# Patient Record
Sex: Male | Born: 1945 | Race: White | Hispanic: No | Smoking: Never smoker
Health system: Southern US, Community
[De-identification: ages and names within clinical notes are randomized; demographics above are authoritative.]

## PROBLEM LIST (undated history)

## (undated) DIAGNOSIS — N201 Calculus of ureter: Secondary | ICD-10-CM

## (undated) DIAGNOSIS — I10 Essential (primary) hypertension: Secondary | ICD-10-CM

## (undated) DIAGNOSIS — E785 Hyperlipidemia, unspecified: Secondary | ICD-10-CM

## (undated) HISTORY — PX: CATARACT EXTRACTION W/ INTRAOCULAR LENS  IMPLANT, BILATERAL: SHX1307

---
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
## 2009-08-29 HISTORY — PX: RETINAL DETACHMENT SURGERY: SHX105

## 2010-03-07 ENCOUNTER — Emergency Department (HOSPITAL_COMMUNITY): Admission: EM | Admit: 2010-03-07 | Discharge: 2010-03-07 | Payer: Self-pay | Admitting: Emergency Medicine

## 2010-06-28 ENCOUNTER — Ambulatory Visit: Payer: Self-pay | Admitting: Sports Medicine

## 2010-06-28 DIAGNOSIS — R269 Unspecified abnormalities of gait and mobility: Secondary | ICD-10-CM | POA: Insufficient documentation

## 2010-06-28 DIAGNOSIS — M25569 Pain in unspecified knee: Secondary | ICD-10-CM | POA: Insufficient documentation

## 2010-06-28 DIAGNOSIS — M2170 Unequal limb length (acquired), unspecified site: Secondary | ICD-10-CM | POA: Insufficient documentation

## 2010-08-12 ENCOUNTER — Ambulatory Visit: Payer: Self-pay | Admitting: Sports Medicine

## 2010-09-28 NOTE — Assessment & Plan Note (Signed)
Summary: NP,KNEE ISSUES,RUNNER,MC   Vital Signs:  Patient profile:   65 year old male Height:      75 inches Weight:      190 pounds BMI:     23.83 BP sitting:   143 / 86  Vitals Entered By: Lillia Pauls CMA (June 28, 2010 9:54 AM)   History of Present Illness: Pain in right knee. Pt has history of old medial miniscus surg in 4. He has continued to run and stay fit since then. However in July of this year he had to take 6 weeks off from running following an eye surgery.  Upon resumption of running he noted pain and swelling of the right knee.  He feels a dull ache posteriorlly. He also notes lack of flexion in his knee. He has stoped running since mid October due to these symptoms.   Preventive Screening-Counseling & Management  Alcohol-Tobacco     Smoking Status: never  Current Problems (verified): 1)  Abnormality of Gait  (ICD-781.2) 2)  Knee Pain, Right, Chronic  (ICD-719.46) 3)  Unequal Leg Length  (ICD-736.81)  Allergies (verified): No Known Drug Allergies  Past History:  Past Surgical History: Right knee likely medial meniscectomy 1976  Social History: No tob, min alcohol. Likes to run or bike dailySmoking Status:  never  Review of Systems  The patient denies chest pain, dyspnea on exertion, and difficulty walking.    Physical Exam  General:  Vs noted.  Tall male in NAD Msk:  Right leg Mature scar medial to patalla. No effusion noted. No skin changes.  ROM limited by 5 deg extension and 20 deg flexion. Normal on left.  Hip ROM is WNL BL Quad bulk is diminished compaired to left.  Strength is 5/5 except for right hip abduction which is 4+/5.  Patalla grind is neg, lachmans and valgus and varus stress is neg. MuMurey's is neg.  Raymondo Band is mildly pos.   Leg length right is 1cm shorter than left both laying and sitting.   Feet: Relativly normal appearing. Calus laterally BL.  Arches WNL.  Gait: Ganus valgus with walking and running. Right foot is  mildly suppinated.    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 Has also resulted in a lack of full extension. This has resulted in a leg length discrepency.  Plan: Body Helix knee sleve, and heel wedge. Also home PT exercises to build quad bulk (quad tense, straight leg raise, and knee extension) and lateral leg raise for hip abductor strength.  Will resume modified activity with every other day running up to 2 miles with bike the other day. RICE after running.  Follow up in 6 weeks.   His updated medication list for this problem includes:    Aspirin 325 Mg Tabs (Aspirin) .Marland Kitchen... Take 1 by mouth once daily  Orders: Garment,belt,sleeve or other covering ,elastic or similar stretch (Z6109) Sports Insoles (L3510) Foot Orthosis ( Arch Strap/Heel Cup) [phone])  Problem # 2:  UNEQUAL LEG LENGTH (ICD-736.81) Assessment: New As noted.  Corrected with heel wedge and Hapads sports insoles.   Problem # 3:  ABNORMALITY OF GAIT (ICD-781.2) Assessment: New Knee genus valgus.   try to correct with wege lift  - medial build up  Complete Medication List: 1)  Lovastatin 40 Mg Tabs (Lovastatin) .... Take 1 by mouth once daily 2)  Lisinopril 20 Mg Tabs (Lisinopril) .... Take 1 by mouth once  daily 3)  Finacea 15 % Gel (Azelaic acid) .... Apply to affected area of face once daily 4)  Aspirin 325 Mg Tabs (Aspirin) .... Take 1 by mouth once daily 5)  Fish Oil Concentrate 300 Mg Caps (Omega-3 fatty acids) .... Take 3 tablets by mouth once daily 6)  Daily Multiple Vitamins Tabs (Multiple vitamin) .... Take 1 by mouth once daily  Patient Instructions: 1)  Thank you for seeing me today. 2)  You can run up to 2 miles every other day. Wear your brace and orthotics.  3)  Bike on the other days as much as you want. 4)  Ice your knee following runs.  A bag of frozen peas works well.  5)  Do the exercises that we  talked about.  6)  Quad tensing, straight leg raise, knee extension, and side leg raise. 7)  Follow up in 6 weeks.  8)  Continue to reccord your workouts and mark down when you have pain or swelling.  9)  Ibuprofen 800mg  every 8 hours of aleive 2 pills twice a day as needed for pain.    Orders Added: 1)  Garment,belt,sleeve or other covering ,elastic or similar stretch [A4466] 2)  Sports Insoles [L3510] 3)  Foot Orthosis ( Arch Strap/Heel Cup) [B1478] 4)  New Patient Level II [29562]

## 2010-09-30 NOTE — Assessment & Plan Note (Signed)
Summary: F/U,MC   Vital Signs:  Patient profile:   65 year old male BP sitting:   143 / 80  Vitals Entered By: AMY CHANEY,RN CC: f/u rt knee pain - 75% improved   CC:  f/u rt knee pain - 75% improved.  History of Present Illness: Pt reports to clinic for f/u of rt knee pain which he reports is 75 % improved.   Has been able to run every other day with less significant knee pain.   Doing knee exercises on days not running.   He had very remote meniscus surgery on RT knee This flared after he had to take 6 wks off running for retinal detachment  has built back up to 3 mile runs and would like to grad inc to 5  Allergies: No Known Drug Allergies  Physical Exam  General:  Well-developed,well-nourished,in no acute distress; alert,appropriate and cooperative throughout examination Msk:  Rt knee lacks 5 degrees extension No effusion today on rt Medial spurring noted on rt Slight limitation on flexion rt knee Positive clicking with Mcmurray's, not painful  Running gait shows less genu valgus with wedge in place note the knee flexion contracture gives him a leg length difference of 1 cm and functionally changes knee position on gait     Impression & Recommendations:  Problem # 1:  KNEE PAIN, RIGHT, CHRONIC (ICD-719.46)  His updated medication list for this problem includes:    Aspirin 325 Mg Tabs (Aspirin) .Marland Kitchen... Take 1 by mouth once daily   keep up exercises 3 x per week  Problem # 2:  ABNORMALITY OF GAIT (ICD-781.2) use insoles and wedges in running shoes  try wedges in reg walking shoes  Problem # 3:  UNEQUAL LEG LENGTH (ICD-736.81) this is acquired and we will cont to add lift to RT  can reck as needed if he cont to make excellent progress  Complete Medication List: 1)  Lovastatin 40 Mg Tabs (Lovastatin) .... Take 1 by mouth once daily 2)  Lisinopril 20 Mg Tabs (Lisinopril) .... Take 1 by mouth once daily 3)  Finacea 15 % Gel (Azelaic acid) .... Apply to  affected area of face once daily 4)  Aspirin 325 Mg Tabs (Aspirin) .... Take 1 by mouth once daily 5)  Fish Oil Concentrate 300 Mg Caps (Omega-3 fatty acids) .... Take 3 tablets by mouth once daily 6)  Daily Multiple Vitamins Tabs (Multiple vitamin) .... Take 1 by mouth once daily   Orders Added: 1)  Est. Patient Level III [16109]

## 2010-12-20 ENCOUNTER — Encounter: Payer: Self-pay | Admitting: *Deleted

## 2011-08-12 ENCOUNTER — Emergency Department (INDEPENDENT_AMBULATORY_CARE_PROVIDER_SITE_OTHER)
Admission: EM | Admit: 2011-08-12 | Discharge: 2011-08-12 | Disposition: A | Discharge status: Home / Self Care | Payer: PRIVATE HEALTH INSURANCE | Source: Home / Self Care | Attending: Family Medicine | Admitting: Family Medicine

## 2011-08-12 DIAGNOSIS — S39012A Strain of muscle, fascia and tendon of lower back, initial encounter: Secondary | ICD-10-CM

## 2011-08-12 DIAGNOSIS — S335XXA Sprain of ligaments of lumbar spine, initial encounter: Secondary | ICD-10-CM

## 2011-08-12 HISTORY — DX: Essential (primary) hypertension: I10

## 2011-08-12 MED ORDER — IBUPROFEN 800 MG PO TABS: 800.0000 mg | ORAL_TABLET | Freq: Three times a day (TID) | ORAL | Status: AC

## 2011-08-12 MED ORDER — CYCLOBENZAPRINE HCL 5 MG PO TABS: 5.0000 mg | ORAL_TABLET | Freq: Three times a day (TID) | ORAL | Status: AC | PRN

## 2011-08-12 NOTE — ED Notes (Signed)
C/o lt lower back pain that is non-radiating.  States started last Saturday- bent over, stood up and noticed the pain.  States he has had something similar before but it didn't persist this long.  Reports he is unable to jog like usual due to the discomfort.

## 2011-08-12 NOTE — ED Provider Notes (Signed)
History     CSN: [id number] Arrival date & time: 08/12/2011  8:34 AM   First MD Initiated Contact with Patient 08/12/11 [phone]      Chief Complaint  Patient presents with  . Back Pain    (Consider location/radiation/quality/duration/timing/severity/associated sxs/prior treatment) Patient is a 65 y.o. male presenting with back pain. The history is provided by the patient.  Back Pain  This is a new problem. The current episode started more than 1 week ago. The problem has been gradually improving. The pain is associated with twisting. The pain is present in the lumbar spine. The quality of the pain is described as stabbing. The pain does not radiate. The pain is mild. The symptoms are aggravated by twisting and bending. Pertinent negatives include no fever, no numbness, no abdominal pain, no dysuria, no leg pain, no paresthesias and no weakness.    Past Medical History  Diagnosis Date  . Hypertension   . High cholesterol     Past Surgical History  Procedure Date  . Cataract extraction   . Knee surgery   . Retinal detachment surgery     No family history on file.  History  Substance Use Topics  . Smoking status: Not on file  . Smokeless tobacco: Not on file  . Alcohol Use:       Review of Systems  Constitutional: Negative for fever.  Gastrointestinal: Negative for abdominal pain.  Genitourinary: Negative for dysuria, urgency and frequency.  Musculoskeletal: Positive for back pain. Negative for gait problem.  Neurological: Negative for weakness, numbness and paresthesias.    Allergies  Review of patient's allergies indicates no known allergies.  Home Medications   Current Outpatient Rx  Name Route Sig Dispense Refill  . ASPIRIN 325 MG PO TABS Oral Take 325 mg by mouth daily.      . AZELAIC ACID 15 % EX GEL  Apply to affected area of face once daily     . LISINOPRIL 20 MG PO TABS Oral Take 20 mg by mouth daily.      Marland Kitchen LOVASTATIN 40 MG PO TABS Oral Take 40 mg by  mouth daily.      Marland Kitchen DAILY MULTIVITAMIN PO Oral Take 1 tablet by mouth. Once daily     . FISH OIL CONCENTRATE 300 MG PO CAPS Oral Take 3 capsules by mouth. Once daily     . CYCLOBENZAPRINE HCL 5 MG PO TABS Oral Take 1 tablet (5 mg total) by mouth 3 (three) times daily as needed for muscle spasms. 15 tablet 0  . IBUPROFEN 800 MG PO TABS Oral Take 1 tablet (800 mg total) by mouth 3 (three) times daily. 30 tablet 0    BP 147/87  Pulse 76  Temp(Src) 97.8 F (36.6 C) (Oral)  Resp 18  SpO2 100%  Physical Exam  Nursing note and vitals reviewed. Constitutional: He appears well-developed and well-nourished.  HENT:  Head: Normocephalic.  Abdominal: Soft. Bowel sounds are normal.  Musculoskeletal: Normal range of motion. He exhibits tenderness. He exhibits no edema.       Lumbar back: He exhibits tenderness. He exhibits normal range of motion, no bony tenderness, no spasm and normal pulse.    ED Course  Procedures (including critical care time)  Labs Reviewed - No data to display No results found.   1. Strain of lumbar region       MDM          Barkley Bruns, MD 08/13/11 1723

## 2012-01-13 ENCOUNTER — Other Ambulatory Visit: Payer: Self-pay | Admitting: Internal Medicine

## 2012-01-13 DIAGNOSIS — I10 Essential (primary) hypertension: Secondary | ICD-10-CM

## 2012-01-19 ENCOUNTER — Other Ambulatory Visit: Payer: PRIVATE HEALTH INSURANCE

## 2013-01-15 ENCOUNTER — Other Ambulatory Visit: Payer: Self-pay | Admitting: Internal Medicine

## 2013-01-15 DIAGNOSIS — I10 Essential (primary) hypertension: Secondary | ICD-10-CM

## 2013-01-22 ENCOUNTER — Ambulatory Visit
Admission: RE | Admit: 2013-01-22 | Discharge: 2013-01-22 | Disposition: A | Discharge status: Home / Self Care | Payer: Medicare PPO | Source: Ambulatory Visit | Attending: Internal Medicine | Admitting: Internal Medicine

## 2013-01-22 DIAGNOSIS — I10 Essential (primary) hypertension: Secondary | ICD-10-CM

## 2013-07-12 ENCOUNTER — Encounter (HOSPITAL_COMMUNITY): Payer: Self-pay | Admitting: Emergency Medicine

## 2013-07-12 ENCOUNTER — Emergency Department (HOSPITAL_COMMUNITY): Payer: Medicare PPO

## 2013-07-12 ENCOUNTER — Emergency Department (HOSPITAL_COMMUNITY)
Admission: EM | Admit: 2013-07-12 | Discharge: 2013-07-13 | Disposition: A | Discharge status: Home / Self Care | Payer: Medicare PPO | Attending: Emergency Medicine | Admitting: Emergency Medicine

## 2013-07-12 ENCOUNTER — Emergency Department (HOSPITAL_COMMUNITY)
Admission: EM | Admit: 2013-07-12 | Discharge: 2013-07-12 | Disposition: A | Discharge status: Other Acute Inpatient Hospital | Payer: Medicare PPO | Source: Home / Self Care | Attending: Emergency Medicine | Admitting: Emergency Medicine

## 2013-07-12 DIAGNOSIS — N200 Calculus of kidney: Secondary | ICD-10-CM

## 2013-07-12 DIAGNOSIS — E7800 Pure hypercholesterolemia, unspecified: Secondary | ICD-10-CM | POA: Insufficient documentation

## 2013-07-12 DIAGNOSIS — R1032 Left lower quadrant pain: Secondary | ICD-10-CM

## 2013-07-12 DIAGNOSIS — N1330 Unspecified hydronephrosis: Secondary | ICD-10-CM

## 2013-07-12 DIAGNOSIS — Z7982 Long term (current) use of aspirin: Secondary | ICD-10-CM | POA: Insufficient documentation

## 2013-07-12 DIAGNOSIS — I10 Essential (primary) hypertension: Secondary | ICD-10-CM | POA: Insufficient documentation

## 2013-07-12 DIAGNOSIS — Z79899 Other long term (current) drug therapy: Secondary | ICD-10-CM | POA: Insufficient documentation

## 2013-07-12 LAB — CBC WITH DIFFERENTIAL/PLATELET
Basophils Relative: 0 % (ref 0–1)
Hemoglobin: 14.6 g/dL (ref 13.0–17.0)
Lymphs Abs: 0.7 10*3/uL (ref 0.7–4.0)
MCHC: 35.6 g/dL (ref 30.0–36.0)
Monocytes Relative: 4 % (ref 3–12)
Neutro Abs: 12.4 10*3/uL — ABNORMAL HIGH (ref 1.7–7.7)
Neutrophils Relative %: 91 % — ABNORMAL HIGH (ref 43–77)
RBC: 4.61 MIL/uL (ref 4.22–5.81)
WBC: 13.6 10*3/uL — ABNORMAL HIGH (ref 4.0–10.5)

## 2013-07-12 LAB — URINALYSIS, ROUTINE W REFLEX MICROSCOPIC
Glucose, UA: NEGATIVE mg/dL
Ketones, ur: 15 mg/dL — AB
Nitrite: NEGATIVE
pH: 5.5 (ref 5.0–8.0)

## 2013-07-12 LAB — COMPREHENSIVE METABOLIC PANEL
Albumin: 4.2 g/dL (ref 3.5–5.2)
Alkaline Phosphatase: 47 U/L (ref 39–117)
BUN: 17 mg/dL (ref 6–23)
Chloride: 101 mEq/L (ref 96–112)
Potassium: 4.1 mEq/L (ref 3.5–5.1)
Total Bilirubin: 0.4 mg/dL (ref 0.3–1.2)

## 2013-07-12 LAB — POCT URINALYSIS DIP (DEVICE)
Bilirubin Urine: NEGATIVE
Glucose, UA: NEGATIVE mg/dL
Leukocytes, UA: NEGATIVE
Nitrite: NEGATIVE

## 2013-07-12 LAB — LIPASE, BLOOD: Lipase: 17 U/L (ref 11–59)

## 2013-07-12 LAB — URINE MICROSCOPIC-ADD ON

## 2013-07-12 MED ORDER — IOHEXOL 300 MG/ML  SOLN
100.0000 mL | Freq: Once | INTRAMUSCULAR | Status: AC | PRN
  Administered 2013-07-12: 100 mL via INTRAVENOUS

## 2013-07-12 MED ORDER — MORPHINE SULFATE 4 MG/ML IJ SOLN
8.0000 mg | Freq: Once | INTRAMUSCULAR | Status: AC
  Administered 2013-07-12: 4 mg via INTRAVENOUS
  Filled 2013-07-12: qty 2

## 2013-07-12 MED ORDER — SODIUM CHLORIDE 0.9 % IV BOLUS (SEPSIS)
1000.0000 mL | Freq: Once | INTRAVENOUS | Status: AC
  Administered 2013-07-12: 1000 mL via INTRAVENOUS

## 2013-07-12 MED ORDER — IOHEXOL 300 MG/ML  SOLN
25.0000 mL | Freq: Once | INTRAMUSCULAR | Status: AC | PRN
  Administered 2013-07-12: 25 mL via ORAL

## 2013-07-12 MED ORDER — ONDANSETRON HCL 4 MG/2ML IJ SOLN
4.0000 mg | Freq: Once | INTRAMUSCULAR | Status: AC
  Administered 2013-07-12: 4 mg via INTRAVENOUS
  Filled 2013-07-12: qty 2

## 2013-07-12 MED ORDER — MORPHINE SULFATE 4 MG/ML IJ SOLN
4.0000 mg | Freq: Once | INTRAMUSCULAR | Status: AC
  Administered 2013-07-12: 4 mg via INTRAVENOUS
  Filled 2013-07-12: qty 1

## 2013-07-12 NOTE — ED Notes (Signed)
The pt was transferred from ucc with abd pain since this am no  n v or diarrhea.  The pt  Reports that he feels better now and they only collected a urine from him

## 2013-07-12 NOTE — ED Provider Notes (Signed)
CSN: [id number]     Arrival date & time 07/12/13  2011 History   First MD Initiated Contact with Patient 07/12/13 2135     Chief Complaint  Patient presents with  . Abdominal Pain   (Consider location/radiation/quality/duration/timing/severity/associated sxs/prior Treatment) HPI Comments: Patient is a 67 year old male with history of hypertension and high cholesterol who presents today with left lower quadrant pain. The pain began around 2 or 3 this afternoon. He went to urgent care because the pain was severe. It is a sharp pain in his left lower quadrant. His last bowel was yesterday. He has normal bowel movements daily. Today he passed some hard stool earlier. He notes that now the pain has moved into his left back. He also has a dull ache in his left testicle. He has had prior colonoscopies. His last colonoscopy was 2 years ago. He was told that it was normal at that time. No prior history of kidney stones. No fevers, chills, nausea, vomiting, shortness of breath, chest pain.   Patient is a 67 y.o. male presenting with abdominal pain. The history is provided by the patient. No language interpreter was used.  Abdominal Pain Associated symptoms: dysuria   Associated symptoms: no chills, no fever and no vomiting     Past Medical History  Diagnosis Date  . Hypertension   . High cholesterol    Past Surgical History  Procedure Laterality Date  . Cataract extraction    . Knee surgery    . Retinal detachment surgery     No family history on file. History  Substance Use Topics  . Smoking status: Never Smoker   . Smokeless tobacco: Not on file  . Alcohol Use: Yes    Review of Systems  Constitutional: Negative for fever and chills.  Gastrointestinal: Positive for abdominal pain. Negative for vomiting.  Genitourinary: Positive for dysuria, difficulty urinating and testicular pain. Negative for urgency.  All other systems reviewed and are negative.    Allergies  Review of  patient's allergies indicates no known allergies.  Home Medications   Current Outpatient Rx  Name  Route  Sig  Dispense  Refill  . aspirin 81 MG tablet   Oral   Take 81 mg by mouth daily.         Marland Kitchen lisinopril (PRINIVIL,ZESTRIL) 20 MG tablet   Oral   Take 20 mg by mouth daily.           Marland Kitchen lovastatin (MEVACOR) 40 MG tablet   Oral   Take 40 mg by mouth daily.           . Multiple Vitamin (DAILY MULTIVITAMIN PO)   Oral   Take 1 tablet by mouth. Once daily           BP 151/75  Pulse 85  Temp(Src) 98.1 F (36.7 C) (Oral)  Resp 18  Wt 196 lb 9 oz (89.16 kg)  SpO2 97% Physical Exam  Nursing note and vitals reviewed. Constitutional: He is oriented to person, place, and time. He appears well-developed and well-nourished. He does not appear ill. He appears distressed.  Pacing the room, unable to get comfortable.  HENT:  Head: Normocephalic and atraumatic.  Right Ear: External ear normal.  Left Ear: External ear normal.  Nose: Nose normal.  Eyes: Conjunctivae are normal.  Neck: Normal range of motion. No tracheal deviation present.  Cardiovascular: Normal rate, regular rhythm and normal heart sounds.   Pulmonary/Chest: Effort normal and breath sounds normal. No stridor.  Abdominal: Soft.  He exhibits no distension. There is no tenderness. There is no rigidity, no rebound, no guarding and no CVA tenderness. Hernia confirmed negative in the right inguinal area and confirmed negative in the left inguinal area.  No tenderness over left flank  Genitourinary: Testes normal and penis normal. Right testis shows no mass, no swelling and no tenderness. Right testis is descended. Cremasteric reflex is not absent on the right side. Left testis shows no mass, no swelling and no tenderness. Left testis is descended. Cremasteric reflex is not absent on the left side. Circumcised.  Musculoskeletal: Normal range of motion.  Lymphadenopathy:       Right: No inguinal adenopathy present.        Left: No inguinal adenopathy present.  Neurological: He is alert and oriented to person, place, and time.  Skin: Skin is warm and dry. He is not diaphoretic.  Psychiatric: He has a normal mood and affect. His behavior is normal.    ED Course  Procedures (including critical care time) Labs Review Labs Reviewed  CBC WITH DIFFERENTIAL - Abnormal; Notable for the following:    WBC 13.6 (*)    Neutrophils Relative % 91 (*)    Neutro Abs 12.4 (*)    Lymphocytes Relative 5 (*)    All other components within normal limits  COMPREHENSIVE METABOLIC PANEL - Abnormal; Notable for the following:    Glucose, Bld 119 (*)    GFR calc non Af Amer 84 (*)    All other components within normal limits  URINALYSIS, ROUTINE W REFLEX MICROSCOPIC - Abnormal; Notable for the following:    Hgb urine dipstick MODERATE (*)    Bilirubin Urine SMALL (*)    Ketones, ur 15 (*)    All other components within normal limits  URINE MICROSCOPIC-ADD ON - Abnormal; Notable for the following:    Squamous Epithelial / LPF FEW (*)    All other components within normal limits  LIPASE, BLOOD   Imaging Review Ct Abdomen Pelvis W Contrast  07/13/2013   CLINICAL DATA:  Worsening lower abdominal pain.  EXAM: CT ABDOMEN AND PELVIS WITH CONTRAST  TECHNIQUE: Multidetector CT imaging of the abdomen and pelvis was performed using the standard protocol following bolus administration of intravenous contrast.  CONTRAST:  OMNIPAQUE IOHEXOL 300 MG/ML  SOLN  COMPARISON:  Abdominal ultrasound performed 01/22/2013  FINDINGS: Mild bibasilar atelectasis is noted. There is mild bronchiectasis at the left lung base.  Hypodensities within the liver, measuring up to 2.2 cm in size, are thought to most likely reflect cysts, though they remain nonspecific in appearance. The liver is otherwise unremarkable in appearance. The spleen is within normal limits. The gallbladder is unremarkable. The pancreas and adrenal glands are normal in appearance.   There is mild to moderate left-sided hydronephrosis, with diffuse prominence of the left ureter along its entire course. Mild asymmetric left-sided perinephric stranding is noted. An obstructing 7 x 5 mm stone is noted within the distal left ureter, just proximal to the left vesicoureteral junction. A few right renal cysts are seen, two of which are parapelvic in nature. Nonspecific perinephric stranding is noted bilaterally. The right kidney is otherwise unremarkable in appearance.  No free fluid is identified. The small bowel is unremarkable in appearance. The stomach is within normal limits. No acute vascular abnormalities are seen. Mild scattered calcification is noted along the abdominal aorta and its branches.  The appendix is normal in caliber, without evidence for appendicitis. There is interposition of the hepatic flexure  of the colon anterior to the liver. Apparent wall thickening at the distal descending and proximal sigmoid colon is thought to reflect relative decompression. The colon is unremarkable in appearance.  The bladder is mildly distended and grossly unremarkable in appearance. The prostate is mildly enlarged, measuring 5.0 cm in transverse dimension. No inguinal lymphadenopathy is seen.  No acute osseous abnormalities are identified.  IMPRESSION: 1. Mild to moderate left-sided hydronephrosis, with diffuse prominence of the left ureter. Obstructing 7 x 5 mm stone noted in the distal left ureter, just proximal to the left vesicoureteral junction. 2. Scattered right renal cysts noted. 3. Likely hepatic cysts seen. 4. Mildly enlarged prostate. 5. Mild bibasilar atelectasis noted; mild bronchiectasis at the left lung base.   Electronically Signed   By: Roanna Raider M.D.   On: 07/13/2013 00:05    EKG Interpretation   None      12:23 AM Discussed case with Dr. Dillard Cannon who will see him in the office on Monday. Return to ED if he has problems over the weekend.   MDM   1. Kidney stone on  left side   2. Hydronephrosis    Patient presents to emergency department with obstructive 7 x 5 mm stone in noted in the distal left ureter with associated mild to moderate left-sided hydronephrosis. He is afebrile, creatinine is within normal limits. Pain has been controlled in the emergency department. I discussed this case with urology who recommends office followup on Monday. I discussed this with the patient and he agrees with plan. Return instructions were given. Vital signs stable for discharge. Dr. Jeraldine Loots evaluated patient and agrees with plan. Patient / Family / Caregiver informed of clinical course, understand medical decision-making process, and agree with plan.   Mora Bellman, PA-C 07/13/13 [phone]

## 2013-07-12 NOTE — ED Provider Notes (Signed)
CSN: [id number]     Arrival date & time 07/12/13  1849 History   First MD Initiated Contact with Patient 07/12/13 1934     Chief Complaint  Patient presents with  . Abdominal Pain   (Consider location/radiation/quality/duration/timing/severity/associated sxs/prior Treatment) HPI Patient is a 67 yo M presenting with acute onset and progressively worsening colicky LLQ abd pain. Started out earlier this afternoon like he had to urinate, but denies dysuria currently. Then he developed the sensation of needing to stool, he sat on toilet and was able to pass small pellets of stool. Denies pain with the bowel movement but states he might had have a small amount of relief. Denies any blood in stool. States he typically has soft stool every day. Never had pain like this before. No history of constipation or kidney stones. He denies fever. Endorses dry mouth, and feels a little "shaky." Only abnormal foods recently are old tuna fish from the fridge (a few days old), and increased chocolate intake. No new medications. No recent travels. No recent vaccines. No new strenuous exercises. States his mother had colon cancer at age 57. His last colonoscopy was within last 2 years, and last one was normal.   Past Medical History  Diagnosis Date  . Hypertension   . High cholesterol    Past Surgical History  Procedure Laterality Date  . Cataract extraction    . Knee surgery    . Retinal detachment surgery     History reviewed. No pertinent family history. History  Substance Use Topics  . Smoking status: Never Smoker   . Smokeless tobacco: Not on file  . Alcohol Use: Yes    Review of Systems  Constitutional: Negative for fever and chills.  HENT: Negative for congestion.   Eyes: Negative for visual disturbance.  Respiratory: Negative for cough and shortness of breath.   Cardiovascular: Negative for chest pain and leg swelling.  Gastrointestinal: Positive for abdominal pain. Negative for nausea,  constipation and blood in stool.  Genitourinary: Negative for dysuria, frequency, decreased urine volume and testicular pain.  Musculoskeletal: Negative for arthralgias and myalgias.  Skin: Negative for rash.  Neurological: Negative for headaches.    Allergies  Review of patient's allergies indicates no known allergies.  Home Medications   Current Outpatient Rx  Name  Route  Sig  Dispense  Refill  . aspirin 81 MG tablet   Oral   Take 81 mg by mouth daily.         Marland Kitchen lisinopril (PRINIVIL,ZESTRIL) 20 MG tablet   Oral   Take 20 mg by mouth daily.           Marland Kitchen lovastatin (MEVACOR) 40 MG tablet   Oral   Take 40 mg by mouth daily.           . Multiple Vitamin (DAILY MULTIVITAMIN PO)   Oral   Take 1 tablet by mouth. Once daily           BP 140/79  Pulse 61  Temp(Src) 96.9 F (36.1 C) (Oral)  Resp 16  SpO2 98% Physical Exam  Vitals reviewed. Constitutional: He is oriented to person, place, and time. He appears well-developed and well-nourished.  Pacing around exam room, unable to sit still.  HENT:  Head: Normocephalic and atraumatic.  Mouth/Throat: Oropharynx is clear and moist.  Neck: Normal range of motion. Neck supple.  Cardiovascular: Normal rate, regular rhythm and normal heart sounds.   Pulmonary/Chest: Effort normal and breath sounds normal. No respiratory distress. He  has no wheezes.  Abdominal: Soft. Bowel sounds are normal. He exhibits no distension. There is tenderness (Pinpoint tenderness in LLQ, near groin). There is no rebound and no guarding.  Musculoskeletal: Normal range of motion. He exhibits no edema and no tenderness.  Neurological: He is alert and oriented to person, place, and time.  Skin: Skin is warm and dry.  Psychiatric: He has a normal mood and affect.    ED Course  Procedures (including critical care time) Labs Review Labs Reviewed  POCT URINALYSIS DIP (DEVICE) - Abnormal; Notable for the following:    Ketones, ur TRACE (*)    Hgb  urine dipstick LARGE (*)    All other components within normal limits   Imaging Review No results found.   MDM   1. LLQ abdominal pain    67 yo healthy male with acute onset LLQ pain. DDx includes diverticulitis, UTI, hernia. Given his amount of pain, will transfer to [facility] ED for further work up including CT scan of abdomen to rule out perforation or abscess. Patient agrees with plan.    Hilarie Fredrickson, MD 07/12/13 [phone]

## 2013-07-12 NOTE — ED Notes (Signed)
States around 2-3 this afternoon, worsening lower abdominal area ,LLQ pain, passing small amts of hard stool; no relief w OTC medications

## 2013-07-12 NOTE — ED Provider Notes (Signed)
Medical screening examination/treatment/procedure(s) were performed by a resident physician and as supervising physician I was immediately available for consultation/collaboration.  Leslee Home, M.D.  Reuben Likes, MD 07/12/13 [phone]

## 2013-07-13 MED ORDER — KETOROLAC TROMETHAMINE 30 MG/ML IJ SOLN
30.0000 mg | Freq: Once | INTRAMUSCULAR | Status: AC
  Administered 2013-07-13: 30 mg via INTRAVENOUS
  Filled 2013-07-13: qty 1

## 2013-07-13 MED ORDER — OXYCODONE-ACETAMINOPHEN 5-325 MG PO TABS: 2.0000 | ORAL_TABLET | Freq: Four times a day (QID) | ORAL | Status: DC | PRN

## 2013-07-13 MED ORDER — ONDANSETRON HCL 4 MG PO TABS: 4.0000 mg | ORAL_TABLET | Freq: Four times a day (QID) | ORAL | Status: DC

## 2013-07-13 NOTE — ED Provider Notes (Signed)
  This was a shared visit with a mid-level provided (NP or PA).  Throughout the patient's course I was available for consultation/collaboration.    On my exam the patient was in no distress.  However, the patient was uncomfortable.  This improved while in the emergency department.  Patient's evaluation demonstrated the presence of kidney stone.  We arranged followup, the patient was discharged in stable condition.      Gerhard Munch, MD 07/13/13 [phone]

## 2013-07-29 ENCOUNTER — Other Ambulatory Visit: Payer: Self-pay | Admitting: Urology

## 2013-08-06 ENCOUNTER — Encounter (HOSPITAL_BASED_OUTPATIENT_CLINIC_OR_DEPARTMENT_OTHER): Payer: Self-pay | Admitting: *Deleted

## 2013-08-06 NOTE — Progress Notes (Signed)
NPO AFTER MN. ARRIVE AT 1030. NEEDS ISTAT AND EKG. MAY TAKE OXYCODONE IF NEEDED W/ SIPS OF WATER.

## 2013-08-06 NOTE — Progress Notes (Signed)
08/06/13 0952  OBSTRUCTIVE SLEEP APNEA  Have you ever been diagnosed with sleep apnea through a sleep study? No  Do you snore loudly (loud enough to be heard through closed doors)?  1  Do you often feel tired, fatigued, or sleepy during the daytime? 0  Has anyone observed you stop breathing during your sleep? 0  Do you have, or are you being treated for high blood pressure? 1  BMI more than 35 kg/m2? 0  Age over 67 years old? 1  Neck circumference greater than 40 cm/18 inches? 0  Gender: 1  Obstructive Sleep Apnea Score 4  Score 4 or greater  Results sent to PCP

## 2013-08-07 ENCOUNTER — Encounter (HOSPITAL_BASED_OUTPATIENT_CLINIC_OR_DEPARTMENT_OTHER)
Admission: RE | Disposition: A | Discharge status: Home / Self Care | Payer: Self-pay | Source: Ambulatory Visit | Attending: Urology

## 2013-08-07 ENCOUNTER — Ambulatory Visit (HOSPITAL_BASED_OUTPATIENT_CLINIC_OR_DEPARTMENT_OTHER): Payer: Medicare PPO | Admitting: Anesthesiology

## 2013-08-07 ENCOUNTER — Ambulatory Visit (HOSPITAL_BASED_OUTPATIENT_CLINIC_OR_DEPARTMENT_OTHER)
Admission: RE | Admit: 2013-08-07 | Discharge: 2013-08-07 | Disposition: A | Discharge status: Home / Self Care | Payer: Medicare PPO | Source: Ambulatory Visit | Attending: Urology | Admitting: Urology

## 2013-08-07 ENCOUNTER — Encounter (HOSPITAL_BASED_OUTPATIENT_CLINIC_OR_DEPARTMENT_OTHER): Payer: Self-pay

## 2013-08-07 ENCOUNTER — Encounter (HOSPITAL_BASED_OUTPATIENT_CLINIC_OR_DEPARTMENT_OTHER): Payer: Medicare PPO | Admitting: Anesthesiology

## 2013-08-07 DIAGNOSIS — E785 Hyperlipidemia, unspecified: Secondary | ICD-10-CM | POA: Insufficient documentation

## 2013-08-07 DIAGNOSIS — N201 Calculus of ureter: Secondary | ICD-10-CM | POA: Insufficient documentation

## 2013-08-07 DIAGNOSIS — N135 Crossing vessel and stricture of ureter without hydronephrosis: Secondary | ICD-10-CM | POA: Insufficient documentation

## 2013-08-07 DIAGNOSIS — I10 Essential (primary) hypertension: Secondary | ICD-10-CM | POA: Insufficient documentation

## 2013-08-07 HISTORY — PX: HOLMIUM LASER APPLICATION: SHX5852

## 2013-08-07 HISTORY — PX: CYSTOSCOPY WITH RETROGRADE PYELOGRAM, URETEROSCOPY AND STENT PLACEMENT: SHX5789

## 2013-08-07 HISTORY — DX: Hyperlipidemia, unspecified: E78.5

## 2013-08-07 HISTORY — DX: Calculus of ureter: N20.1

## 2013-08-07 LAB — POCT I-STAT 4, (NA,K, GLUC, HGB,HCT)
Glucose, Bld: 91 mg/dL (ref 70–99)
Potassium: 3.9 mEq/L (ref 3.5–5.1)

## 2013-08-07 SURGERY — CYSTOURETEROSCOPY, WITH RETROGRADE PYELOGRAM AND STENT INSERTION
Anesthesia: General | Laterality: Left

## 2013-08-07 MED ORDER — TAMSULOSIN HCL 0.4 MG PO CAPS: 0.4000 mg | ORAL_CAPSULE | Freq: Every day | ORAL | Status: DC

## 2013-08-07 MED ORDER — ONDANSETRON HCL 4 MG/2ML IJ SOLN
INTRAMUSCULAR | Status: DC | PRN
  Administered 2013-08-07: 4 mg via INTRAVENOUS

## 2013-08-07 MED ORDER — LIDOCAINE HCL 2 % EX GEL
CUTANEOUS | Status: DC | PRN
  Administered 2013-08-07: 1 via URETHRAL

## 2013-08-07 MED ORDER — PROMETHAZINE HCL 25 MG/ML IJ SOLN
6.2500 mg | INTRAMUSCULAR | Status: DC | PRN
  Filled 2013-08-07: qty 1

## 2013-08-07 MED ORDER — BELLADONNA ALKALOIDS-OPIUM 16.2-60 MG RE SUPP
RECTAL | Status: DC | PRN
  Administered 2013-08-07: 1 via RECTAL

## 2013-08-07 MED ORDER — MIDAZOLAM HCL 2 MG/2ML IJ SOLN
INTRAMUSCULAR | Status: AC
  Filled 2013-08-07: qty 2

## 2013-08-07 MED ORDER — NEOSTIGMINE METHYLSULFATE 1 MG/ML IJ SOLN
INTRAMUSCULAR | Status: DC | PRN
  Administered 2013-08-07: 5 mg via INTRAVENOUS

## 2013-08-07 MED ORDER — CEFAZOLIN SODIUM-DEXTROSE 2-3 GM-% IV SOLR
2.0000 g | INTRAVENOUS | Status: AC
  Administered 2013-08-07: 2 g via INTRAVENOUS
  Filled 2013-08-07: qty 50

## 2013-08-07 MED ORDER — PROPOFOL 10 MG/ML IV BOLUS
INTRAVENOUS | Status: DC | PRN
  Administered 2013-08-07: 200 mg via INTRAVENOUS

## 2013-08-07 MED ORDER — OXYBUTYNIN CHLORIDE 5 MG PO TABS: 5.0000 mg | ORAL_TABLET | Freq: Four times a day (QID) | ORAL | Status: DC | PRN

## 2013-08-07 MED ORDER — OXYCODONE-ACETAMINOPHEN 5-325 MG PO TABS: 1.0000 | ORAL_TABLET | ORAL | Status: DC | PRN

## 2013-08-07 MED ORDER — IOHEXOL 350 MG/ML SOLN
INTRAVENOUS | Status: DC | PRN
  Administered 2013-08-07: 7 mL via URETHRAL

## 2013-08-07 MED ORDER — MIDAZOLAM HCL 5 MG/5ML IJ SOLN
INTRAMUSCULAR | Status: DC | PRN
  Administered 2013-08-07: 2 mg via INTRAVENOUS

## 2013-08-07 MED ORDER — FENTANYL CITRATE 0.05 MG/ML IJ SOLN
25.0000 ug | INTRAMUSCULAR | Status: DC | PRN
  Filled 2013-08-07: qty 1

## 2013-08-07 MED ORDER — KETOROLAC TROMETHAMINE 30 MG/ML IJ SOLN
15.0000 mg | Freq: Once | INTRAMUSCULAR | Status: DC | PRN
  Filled 2013-08-07: qty 1

## 2013-08-07 MED ORDER — PHENAZOPYRIDINE HCL 100 MG PO TABS: 100.0000 mg | ORAL_TABLET | Freq: Three times a day (TID) | ORAL | Status: DC | PRN

## 2013-08-07 MED ORDER — SODIUM CHLORIDE 0.9 % IR SOLN
Status: DC | PRN
  Administered 2013-08-07: 3000 mL via INTRAVESICAL

## 2013-08-07 MED ORDER — CEPHALEXIN 500 MG PO CAPS: 500.0000 mg | ORAL_CAPSULE | Freq: Three times a day (TID) | ORAL | Status: DC

## 2013-08-07 MED ORDER — KETOROLAC TROMETHAMINE 30 MG/ML IJ SOLN
INTRAMUSCULAR | Status: DC | PRN
  Administered 2013-08-07: 30 mg via INTRAVENOUS

## 2013-08-07 MED ORDER — BELLADONNA ALKALOIDS-OPIUM 16.2-60 MG RE SUPP
RECTAL | Status: AC
  Filled 2013-08-07: qty 1

## 2013-08-07 MED ORDER — SENNOSIDES-DOCUSATE SODIUM 8.6-50 MG PO TABS: 1.0000 | ORAL_TABLET | Freq: Two times a day (BID) | ORAL | Status: DC

## 2013-08-07 MED ORDER — LACTATED RINGERS IV SOLN
INTRAVENOUS | Status: DC
  Administered 2013-08-07 (×2): via INTRAVENOUS
  Filled 2013-08-07: qty 1000

## 2013-08-07 MED ORDER — ROCURONIUM BROMIDE 100 MG/10ML IV SOLN
INTRAVENOUS | Status: DC | PRN
  Administered 2013-08-07: 35 mg via INTRAVENOUS

## 2013-08-07 MED ORDER — GLYCOPYRROLATE 0.2 MG/ML IJ SOLN
INTRAMUSCULAR | Status: DC | PRN
  Administered 2013-08-07: 1 mg via INTRAVENOUS

## 2013-08-07 MED ORDER — DEXAMETHASONE SODIUM PHOSPHATE 4 MG/ML IJ SOLN
INTRAMUSCULAR | Status: DC | PRN
  Administered 2013-08-07: 10 mg via INTRAVENOUS

## 2013-08-07 MED ORDER — ACETAMINOPHEN 10 MG/ML IV SOLN
INTRAVENOUS | Status: DC | PRN
  Administered 2013-08-07: 1000 mg via INTRAVENOUS

## 2013-08-07 MED ORDER — FENTANYL CITRATE 0.05 MG/ML IJ SOLN
INTRAMUSCULAR | Status: AC
  Filled 2013-08-07: qty 2

## 2013-08-07 MED ORDER — LIDOCAINE HCL (CARDIAC) 20 MG/ML IV SOLN
INTRAVENOUS | Status: DC | PRN
  Administered 2013-08-07: 50 mg via INTRAVENOUS

## 2013-08-07 SURGICAL SUPPLY — 38 items
BAG DRAIN URO-CYSTO SKYTR STRL (DRAIN) ×2 IMPLANT
BASKET LASER NITINOL 1.9FR (BASKET) IMPLANT
BASKET STNLS GEMINI 4WIRE 3FR (BASKET) IMPLANT
BASKET ZERO TIP NITINOL 2.4FR (BASKET) ×2 IMPLANT
BRUSH URET BIOPSY 3F (UROLOGICAL SUPPLIES) IMPLANT
CANISTER SUCT LVC 12 LTR MEDI- (MISCELLANEOUS) ×2 IMPLANT
CATH CLEAR GEL 3F BACKSTOP (CATHETERS) ×2 IMPLANT
CATH INTERMIT  6FR 70CM (CATHETERS) IMPLANT
CATH URET 5FR 28IN CONE TIP (BALLOONS)
CATH URET 5FR 28IN OPEN ENDED (CATHETERS) ×2 IMPLANT
CATH URET 5FR 70CM CONE TIP (BALLOONS) IMPLANT
CATH URET DUAL LUMEN 6-10FR 50 (CATHETERS) IMPLANT
CLOTH BEACON ORANGE TIMEOUT ST (SAFETY) ×2 IMPLANT
DRAPE CAMERA CLOSED 9X96 (DRAPES) ×2 IMPLANT
ELECT REM PT RETURN 9FT ADLT (ELECTROSURGICAL)
ELECTRODE REM PT RTRN 9FT ADLT (ELECTROSURGICAL) IMPLANT
FIBER LASER FLEXIVA 200 (UROLOGICAL SUPPLIES) ×2 IMPLANT
FIBER LASER FLEXIVA 365 (UROLOGICAL SUPPLIES) IMPLANT
GLOVE BIO SURGEON STRL SZ7 (GLOVE) ×2 IMPLANT
GLOVE ECLIPSE 7.0 STRL STRAW (GLOVE) ×2 IMPLANT
GLOVE INDICATOR 7.5 STRL GRN (GLOVE) ×2 IMPLANT
GOWN PREVENTION PLUS LG XLONG (DISPOSABLE) ×2 IMPLANT
GUIDEWIRE 0.038 PTFE COATED (WIRE) IMPLANT
GUIDEWIRE ANG ZIPWIRE 038X150 (WIRE) IMPLANT
GUIDEWIRE STR DUAL SENSOR (WIRE) ×4 IMPLANT
IV NS IRRIG 3000ML ARTHROMATIC (IV SOLUTION) ×2 IMPLANT
KIT BALLIN UROMAX 15FX10 (LABEL) IMPLANT
KIT BALLN UROMAX 15FX4 (MISCELLANEOUS) IMPLANT
KIT BALLN UROMAX 26 75X4 (MISCELLANEOUS)
PACK CYSTOSCOPY (CUSTOM PROCEDURE TRAY) ×2 IMPLANT
SET HIGH PRES BAL DIL (LABEL)
SHEATH ACCESS URETERAL 38CM (SHEATH) IMPLANT
SHEATH ACCESS URETERAL 54CM (SHEATH) IMPLANT
SHEATH URET ACCESS 12FR/35CM (UROLOGICAL SUPPLIES) IMPLANT
SHEATH URET ACCESS 12FR/55CM (UROLOGICAL SUPPLIES) IMPLANT
STENT POLARIS 6FRX24X.038 (STENTS) IMPLANT
STENT POLARIS 6FRX28X.035 (STENTS) ×2 IMPLANT
SYRINGE IRR TOOMEY STRL 70CC (SYRINGE) ×2 IMPLANT

## 2013-08-07 NOTE — Transfer of Care (Signed)
Immediate Anesthesia Transfer of Care Note  Patient: Alex Nguyen  Procedure(s) Performed: Procedure(s): CYSTOSCOPY WITH RETROGRADE PYELOGRAM, URETEROSCOPY AND  STENT PLACEMENT (Left) HOLMIUM LASER APPLICATION (Left)  Patient Location: PACU  Anesthesia Type:General  Level of Consciousness: sedated  Airway & Oxygen Therapy: Patient Spontanous Breathing and Patient connected to nasal cannula oxygen  Post-op Assessment: Report given to PACU RN  Post vital signs: Reviewed and stable  Complications: No apparent anesthesia complications

## 2013-08-07 NOTE — H&P (Signed)
Urology History and Physical Exam  CC: Left ureter stone  HPI: 67 year old male presents for a left ureter stone. This was discovered on CT in the ER. It is located in the left distal ureter. It is 7 x 5 mm in size. It is associated with left flank pain. He presents for cystoscopy, left ureteroscopy, laser lithotripsy, left retrograde pyelogram, possible left ureter stent placement. UA 07/22/13 was negative for signs/symptoms of infection. We have discussed risks/benefits/alternative/likelihood of achieving goals. There were no other stones on his CT scan.  PMH: Past Medical History  Diagnosis Date  . Hypertension   . Hyperlipidemia   . Left ureteral calculus     PSH: Past Surgical History  Procedure Laterality Date  . Retinal detachment surgery Right 2011  . Knee arthroscopy w/ meniscal repair Right 1960'S  . Cataract extraction w/ intraocular lens  implant, bilateral      Allergies: No Known Allergies  Medications: No prescriptions prior to admission     Social History: History   Social History  . Marital Status: Widowed    Spouse Name: N/A    Number of Children: N/A  . Years of Education: N/A   Occupational History  . Not on file.   Social History Main Topics  . Smoking status: Never Smoker   . Smokeless tobacco: Never Used  . Alcohol Use: Yes     Comment: RARE  . Drug Use: No  . Sexual Activity: Not on file   Other Topics Concern  . Not on file   Social History Narrative  . No narrative on file    Family History: History reviewed. No pertinent family history.  Review of Systems: Positive: Nausea, left flank pain. Negative: Fever, SOB, or chest pain.  A further 10 point review of systems was negative except what is listed in the HPI.  Physical Exam: Filed Vitals:   08/07/13 1104  BP: 140/78  Pulse: 65  Temp: 96.7 F (35.9 C)  Resp: 18    General: No acute distress.  Awake. Head:  Normocephalic.  Atraumatic. ENT:  EOMI.  Mucous membranes  moist Neck:  Supple.  No lymphadenopathy. CV:  S1 present. S2 present. Regular rate. Pulmonary: Equal effort bilaterally.  Clear to auscultation bilaterally. Abdomen: Soft.  Non- tender to palpation. Skin:  Normal turgor.  No visible rash. Extremity: No gross deformity of bilateral upper extremities.  No gross deformity of    bilateral lower extremities. Neurologic: Alert. Appropriate mood.    Studies:  No results found for this basename: HGB, WBC, PLT,  in the last 72 hours  No results found for this basename: NA, K, CL, CO2, BUN, CREATININE, CALCIUM, MAGNESIUM, GFRNONAA, GFRAA,  in the last 72 hours   No results found for this basename: PT, INR, APTT,  in the last 72 hours   No components found with this basename: ABG,     Assessment:  Left distal ureter stone.  Plan: To OR for cystoscopy, left ureteroscopy, laser lithotripsy, left retrograde pyelogram, possible left ureter stent placement.

## 2013-08-07 NOTE — Anesthesia Preprocedure Evaluation (Addendum)
Anesthesia Evaluation  Patient identified by MRN, date of birth, ID band Patient awake    Reviewed: Allergy & Precautions, H&P , NPO status , Patient's Chart, lab work & pertinent test results  Airway Mallampati: II TM Distance: >3 FB Neck ROM: Full    Dental no notable dental hx.    Pulmonary neg pulmonary ROS,  breath sounds clear to auscultation  Pulmonary exam normal       Cardiovascular hypertension, Pt. on medications Rhythm:Regular Rate:Normal     Neuro/Psych negative neurological ROS  negative psych ROS   GI/Hepatic negative GI ROS, Neg liver ROS,   Endo/Other  negative endocrine ROS  Renal/GU negative Renal ROS  negative genitourinary   Musculoskeletal negative musculoskeletal ROS (+)   Abdominal   Peds negative pediatric ROS (+)  Hematology negative hematology ROS (+)   Anesthesia Other Findings   Reproductive/Obstetrics negative OB ROS                          Anesthesia Physical Anesthesia Plan  ASA: II  Anesthesia Plan: General   Post-op Pain Management:    Induction: Intravenous  Airway Management Planned: Oral ETT  Additional Equipment:   Intra-op Plan:   Post-operative Plan:   Informed Consent: I have reviewed the patients History and Physical, chart, labs and discussed the procedure including the risks, benefits and alternatives for the proposed anesthesia with the patient or authorized representative who has indicated his/her understanding and acceptance.   Dental advisory given  Plan Discussed with: CRNA and Surgeon  Anesthesia Plan Comments:        Anesthesia Quick Evaluation

## 2013-08-07 NOTE — Op Note (Signed)
Urology Operative Report  Date of Procedure: 08/07/13  Surgeon: Natalia Leatherwood, MD Assistant:  None  Preoperative Diagnosis: Left distal ureter stone. Postoperative Diagnosis: Left distal ureter stone. Left distal ureter stenosis.  Procedure(s): Cystoscopy. Left ureteroscopy with laser lithotripsy and stone removal. Left retrograde pyelogram with interpretation. Left ureter stent placement (6 x 28 polaris without tether)  Estimated blood loss: Minimal  Specimen: Stones sent for analysis at [facility] lab.  Drains: None  Complications: None  Findings: Left distal ureter stone. Left distal ureter stenosis; difficult to tell the etiology.  History of present illness: Patient presented with a left distal ureter stone. He presents today for left ureteroscopy.   Procedure in detail: After informed consent was obtained, the patient was taken to the operating room. They were placed in the supine position. SCDs were turned on and in place. IV antibiotics were infused, and general anesthesia was induced. A timeout was performed in which the correct patient, surgical site, and procedure were identified and agreed upon by the team.  The patient was placed in a dorsolithotomy position, making sure to pad all pertinent neurovascular pressure points. A belladonna and opium suppository was placed into the rectum. The genitals were prepped and draped in the usual sterile fashion.  A rigid cystoscope was best to the urethra and into the bladder. The bladder was drained and then fully distended and evaluated in a systematic fashion to visualize the entire surface of the bladder. This was negative for tumors.  Attention was turned to left ureter orifice. It was cannulated with a 5 [place] ureter catheter. I injected contrast to obtain a left retrograde pyelogram. There were no filling defects but there was noted to be hydroureter with narrowing right at the distal ureter. I placed a sensor wire into the  left ureter and up into the left renal pelvis on fluoroscopy. This was secured to the drape as a safety wire. The patient was then paralyzed to perform left ureteroscopy by advancing a semirigid ureteroscope through the urethra and into the bladder. I was able to navigate into the left distal ureter, but this was noted to be very tight due to stenosis. Is difficult to tell if this could be stenosis due to inflammation from the stone versus stenosis present prior to the stone trying to pass. The stone was encountered in the left distal ureter. I then placed the backstop gel proximal to the stone. I then performed lithotripsy by placing a 200  holmium laser filament and performed this lithotripsy at 0.5 J and 20 Hz. Stone fragments were broken up into small pieces and they were removed in place and the bladder with a 0 tip Nitinol basket. I then washed stone fragments out of the bladder and sent these to the [facility] lab for chemical analysis.  Due to the stenosis I elected to leave a ureter stent. Safety wire was threaded through the cystoscope and a 6 x 28 Polaris stent was placed without the tethering string. The bladder was drained, and I placed 10 cc of lidocaine jelly into the urethra.  He's placed back in a supine position, anesthesia was reversed, and he was taken to the [facility] in stable condition.  He was given Keflex to start before his ureter stent was removed in clinic.  All counts were correct at the end of the case.

## 2013-08-07 NOTE — Anesthesia Procedure Notes (Signed)
Procedure Name: Intubation Date/Time: 08/07/2013 2:04 PM Performed by: Maris Berger T Pre-anesthesia Checklist: Patient identified, Emergency Drugs available, Suction available and Patient being monitored Patient Re-evaluated:Patient Re-evaluated prior to inductionOxygen Delivery Method: Circle System Utilized Preoxygenation: Pre-oxygenation with 100% oxygen Intubation Type: IV induction Ventilation: Mask ventilation without difficulty Laryngoscope Size: Mac and 4 Grade View: Grade I Tube type: Oral Tube size: 8.0 mm Number of attempts: 1 Airway Equipment and Method: stylet and oral airway Placement Confirmation: ETT inserted through vocal cords under direct vision,  positive ETCO2 and breath sounds checked- equal and bilateral Secured at: 22 cm Tube secured with: Tape Dental Injury: Teeth and Oropharynx as per pre-operative assessment

## 2013-08-08 ENCOUNTER — Encounter (HOSPITAL_BASED_OUTPATIENT_CLINIC_OR_DEPARTMENT_OTHER): Payer: Self-pay | Admitting: Urology

## 2013-08-08 ENCOUNTER — Emergency Department (HOSPITAL_COMMUNITY)
Admission: EM | Admit: 2013-08-08 | Discharge: 2013-08-08 | Disposition: A | Discharge status: Home / Self Care | Payer: Medicare PPO | Attending: Emergency Medicine | Admitting: Emergency Medicine

## 2013-08-08 DIAGNOSIS — Z79899 Other long term (current) drug therapy: Secondary | ICD-10-CM | POA: Insufficient documentation

## 2013-08-08 DIAGNOSIS — N390 Urinary tract infection, site not specified: Secondary | ICD-10-CM | POA: Insufficient documentation

## 2013-08-08 DIAGNOSIS — I10 Essential (primary) hypertension: Secondary | ICD-10-CM | POA: Insufficient documentation

## 2013-08-08 DIAGNOSIS — Z7982 Long term (current) use of aspirin: Secondary | ICD-10-CM | POA: Insufficient documentation

## 2013-08-08 DIAGNOSIS — Z792 Long term (current) use of antibiotics: Secondary | ICD-10-CM | POA: Insufficient documentation

## 2013-08-08 DIAGNOSIS — Z9889 Other specified postprocedural states: Secondary | ICD-10-CM | POA: Insufficient documentation

## 2013-08-08 DIAGNOSIS — E785 Hyperlipidemia, unspecified: Secondary | ICD-10-CM | POA: Insufficient documentation

## 2013-08-08 LAB — URINALYSIS, ROUTINE W REFLEX MICROSCOPIC
Glucose, UA: 100 mg/dL — AB
Ketones, ur: 15 mg/dL — AB
Specific Gravity, Urine: 1.018 (ref 1.005–1.030)
Urobilinogen, UA: 1 mg/dL (ref 0.0–1.0)
pH: 6 (ref 5.0–8.0)

## 2013-08-08 LAB — URINE MICROSCOPIC-ADD ON

## 2013-08-08 MED ORDER — CIPROFLOXACIN HCL 500 MG PO TABS
500.0000 mg | ORAL_TABLET | Freq: Once | ORAL | Status: AC
  Administered 2013-08-08: 500 mg via ORAL
  Filled 2013-08-08: qty 1

## 2013-08-08 MED ORDER — CIPROFLOXACIN HCL 500 MG PO TABS: 500.0000 mg | ORAL_TABLET | Freq: Two times a day (BID) | ORAL | Status: DC

## 2013-08-08 NOTE — Anesthesia Postprocedure Evaluation (Signed)
  Anesthesia Post-op Note  Patient: Alex Nguyen  Procedure(s) Performed: Procedure(s) (LRB): CYSTOSCOPY WITH RETROGRADE PYELOGRAM, URETEROSCOPY AND  STENT PLACEMENT (Left) HOLMIUM LASER APPLICATION (Left)  Patient Location: PACU  Anesthesia Type: General  Level of Consciousness: awake and alert   Airway and Oxygen Therapy: Patient Spontanous Breathing  Post-op Pain: mild  Post-op Assessment: Post-op Vital signs reviewed, Patient's Cardiovascular Status Stable, Respiratory Function Stable, Patent Airway and No signs of Nausea or vomiting  Last Vitals:  Filed Vitals:   08/07/13 1615  BP: 131/73  Pulse: 55  Temp: 36.1 C  Resp: 18    Post-op Vital Signs: stable   Complications: No apparent anesthesia complications

## 2013-08-08 NOTE — ED Provider Notes (Signed)
Medical screening examination/treatment/procedure(s) were conducted as a shared visit with non-physician practitioner(s) and myself.  I personally evaluated the patient during the encounter.  On my exam the patient was in no distress. We discussed the need for ongoing ABX, close monitoring and urology F/U tomorrow (via phone).   EKG Interpretation   None         Gerhard Munch, MD 08/08/13 2002

## 2013-08-08 NOTE — ED Notes (Signed)
Pt given discharge instructions by Roxy Horseman PA.   Pt denies any pain at this time and voices understanding of discharge instructions.

## 2013-08-08 NOTE — ED Notes (Signed)
Pt in NAD, ambulatory in room. States he had surgery to remove kidney stone yesterday, states all day today has felt the urge to urinate but has been unable to. Denies pain at this time, states he just feels the urge to go.

## 2013-08-08 NOTE — ED Notes (Signed)
Pt discharged by Woodroe Chen, RN

## 2013-08-08 NOTE — ED Notes (Signed)
Pt had renal stent placed yesterday. When post procedure nurse called to check on him he reported that he has had urinary frequency today and she instructed him to come to er for evaluation. He was given 6 medications to take prn at home and did not take them and all, he did not think he needed them but now feels that maybe one of those medications would have helped. No pain

## 2013-08-08 NOTE — ED Provider Notes (Signed)
CSN: [id number]     Arrival date & time 08/08/13  1556 History   First MD Initiated Contact with Patient 08/08/13 1726     Chief Complaint  Patient presents with  . Urinary Frequency   (Consider location/radiation/quality/duration/timing/severity/associated sxs/prior Treatment) HPI Comments: Patient presents emergency department with chief complaint of urinary frequency. Yesterday, the patient had a renal stent placed by Dr. Margarita Grizzle. Patient states that he was told by the surgical Center to come to the ED for further evaluation. He denies any fever, or chills. He states that he does have some dysuria. Denies any nausea or vomiting.  The history is provided by the patient. No language interpreter was used.    Past Medical History  Diagnosis Date  . Hypertension   . Hyperlipidemia   . Left ureteral calculus    Past Surgical History  Procedure Laterality Date  . Retinal detachment surgery Right 2011  . Knee arthroscopy w/ meniscal repair Right 1960'S  . Cataract extraction w/ intraocular lens  implant, bilateral    . Cystoscopy with retrograde pyelogram, ureteroscopy and stent placement Left 08/07/2013    Procedure: CYSTOSCOPY WITH RETROGRADE PYELOGRAM, URETEROSCOPY AND  STENT PLACEMENT;  Surgeon: Milford Cage, MD;  Location: [facility];  Service: Urology;  Laterality: Left;  . Holmium laser application Left 08/07/2013    Procedure: HOLMIUM LASER APPLICATION;  Surgeon: Milford Cage, MD;  Location: [facility];  Service: Urology;  Laterality: Left;   History reviewed. No pertinent family history. History  Substance Use Topics  . Smoking status: Never Smoker   . Smokeless tobacco: Never Used  . Alcohol Use: Yes     Comment: RARE    Review of Systems  All other systems reviewed and are negative.    Allergies  Review of patient's allergies indicates no known allergies.  Home Medications   Current Outpatient Rx  Name  Route   Sig  Dispense  Refill  . aspirin 81 MG tablet   Oral   Take 81 mg by mouth daily.         . cephALEXin (KEFLEX) 500 MG capsule   Oral   Take 1 capsule (500 mg total) by mouth 3 (three) times daily. Begin the day before your return to clinic.   9 capsule   0   . lisinopril (PRINIVIL,ZESTRIL) 20 MG tablet   Oral   Take 20 mg by mouth every morning.          . lovastatin (MEVACOR) 40 MG tablet   Oral   Take 40 mg by mouth at bedtime.          . Multiple Vitamin (DAILY MULTIVITAMIN PO)   Oral   Take 1 tablet by mouth. Once daily          . oxybutynin (DITROPAN) 5 MG tablet   Oral   Take 1 tablet (5 mg total) by mouth every 6 (six) hours as needed for bladder spasms.   40 tablet   4   . oxyCODONE-acetaminophen (PERCOCET/ROXICET) 5-325 MG per tablet   Oral   Take 1-2 tablets by mouth every 4 (four) hours as needed for moderate pain or severe pain.   50 tablet   0   . phenazopyridine (PYRIDIUM) 100 MG tablet   Oral   Take 1 tablet (100 mg total) by mouth every 8 (eight) hours as needed for pain (Burning urination.  Will turn urine and body fluids orange.).   30 tablet   1   .  senna-docusate (SENOKOT S) 8.6-50 MG per tablet   Oral   Take 1 tablet by mouth 2 (two) times daily.   60 tablet   0   . tamsulosin (FLOMAX) 0.4 MG CAPS capsule   Oral   Take 1 capsule (0.4 mg total) by mouth daily after supper.   30 capsule   2    BP 162/97  Pulse 75  Temp(Src) 98.4 F (36.9 C) (Oral)  Resp 16  Ht 6\' 3"  (1.905 m)  Wt 199 lb 4.8 oz (90.402 kg)  BMI 24.91 kg/m2  SpO2 97% Physical Exam  Nursing note and vitals reviewed. Constitutional: He is oriented to person, place, and time. He appears well-developed and well-nourished.  HENT:  Head: Normocephalic and atraumatic.  Eyes: Conjunctivae and EOM are normal.  Neck: Normal range of motion.  Cardiovascular: Normal rate.   Pulmonary/Chest: Effort normal.  Abdominal: He exhibits no distension.  Musculoskeletal:  Normal range of motion.  Neurological: He is alert and oriented to person, place, and time.  Skin: Skin is dry.  Psychiatric: He has a normal mood and affect. His behavior is normal. Judgment and thought content normal.    ED Course  Procedures (including critical care time) Labs Review Labs Reviewed  URINALYSIS, ROUTINE W REFLEX MICROSCOPIC - Abnormal; Notable for the following:    Color, Urine RED (*)    APPearance TURBID (*)    Glucose, UA 100 (*)    Hgb urine dipstick LARGE (*)    Bilirubin Urine MODERATE (*)    Ketones, ur 15 (*)    Protein, ur 100 (*)    Nitrite POSITIVE (*)    Leukocytes, UA MODERATE (*)    All other components within normal limits  URINE MICROSCOPIC-ADD ON - Abnormal; Notable for the following:    Squamous Epithelial / LPF FEW (*)    All other components within normal limits   Imaging Review No results found.  EKG Interpretation   None       MDM   1. UTI (lower urinary tract infection)     Patient with UTI.  Renal stent placed yesterday. We'll contact Dr. Margarita Grizzle for recommendations. Anticipate discharge with antibiotics.  On-call recommends cipro.  Will give a dose here, and discharge.  Discussed the patient with Dr. Jeraldine Loots, who agrees with the plan.    Roxy Horseman, PA-C 08/08/13 1931

## 2013-08-09 LAB — URINE CULTURE: Colony Count: NO GROWTH

## 2014-05-12 IMAGING — CT CT ABD-PELV W/ CM
2 of 8 series · 14 of 46 positions shown, 18 images · IV contrast (CONTRAST)
Comparison: Abdominal ultrasound performed 01/22/2013

CLINICAL DATA: Worsening lower abdominal pain.

EXAM:
CT ABDOMEN AND PELVIS WITH CONTRAST
TECHNIQUE: Multidetector CT imaging of the abdomen and pelvis was performed
using the standard protocol following bolus administration of
intravenous contrast.
CONTRAST:  100mL OMNIPAQUE IOHEXOL 300 MG/ML  SOLN

[Series 2: routine · axial · 0.74mm/px · z∈[-789,-389]mm · 11 of 92 slices shown, 15 images]
[im 6/92  soft-tissue]
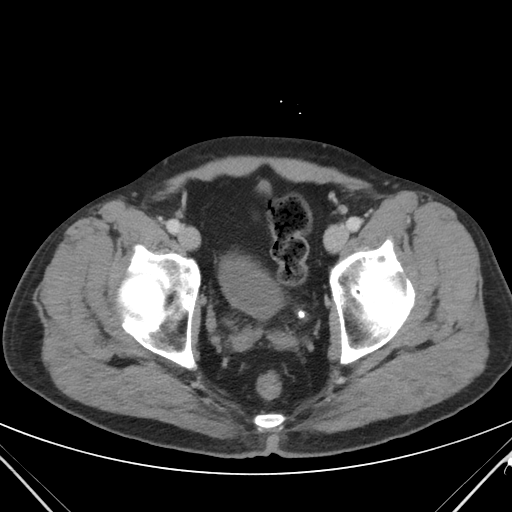
[im 6/92  bone]
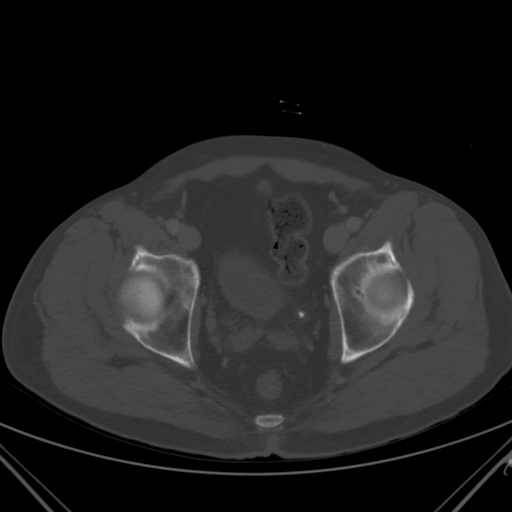
[im 18/92  soft-tissue]
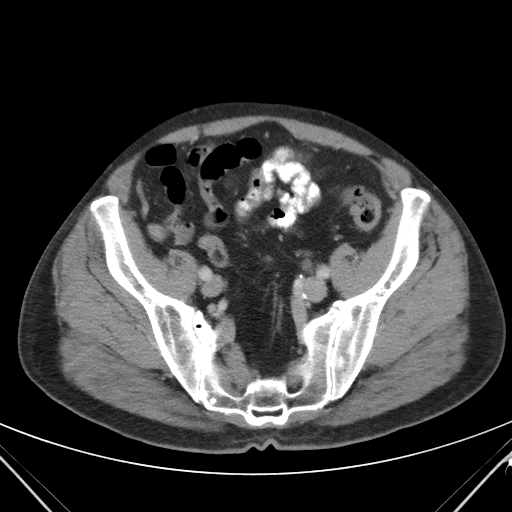
[im 29/92  soft-tissue]
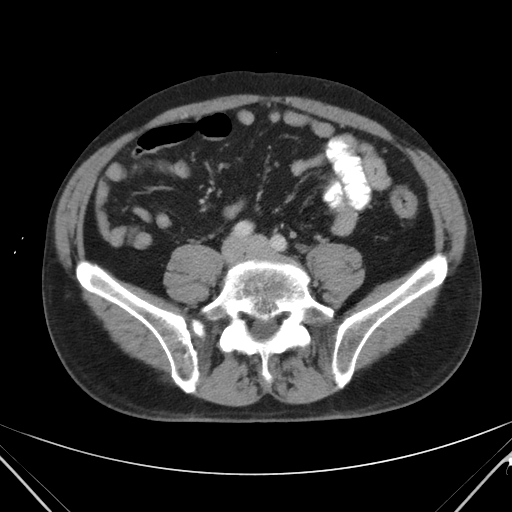
[im 35/92  soft-tissue]
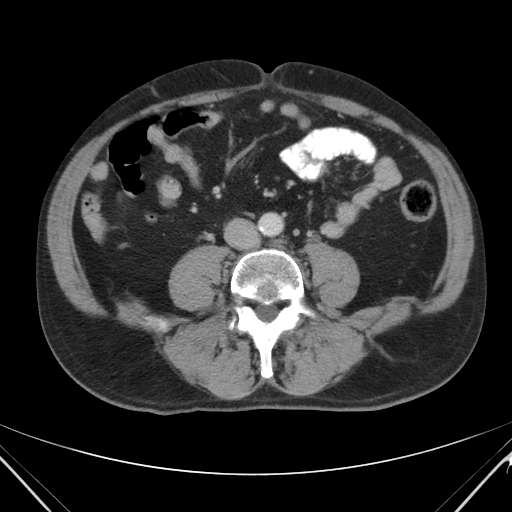
[im 46/92  soft-tissue]
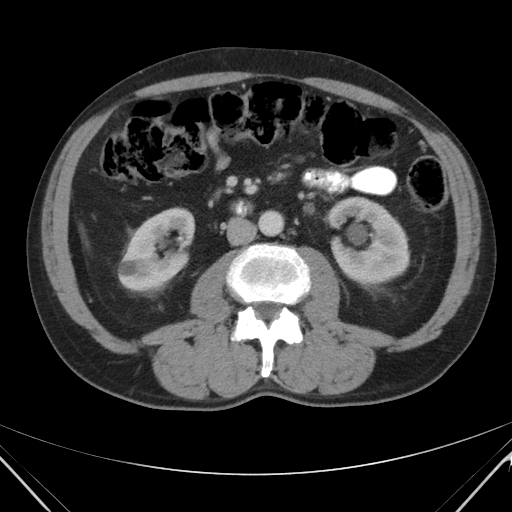
[im 57/92  soft-tissue]
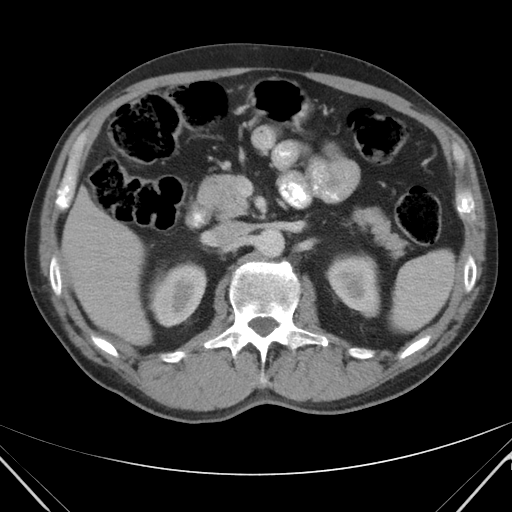
[im 63/92  soft-tissue]
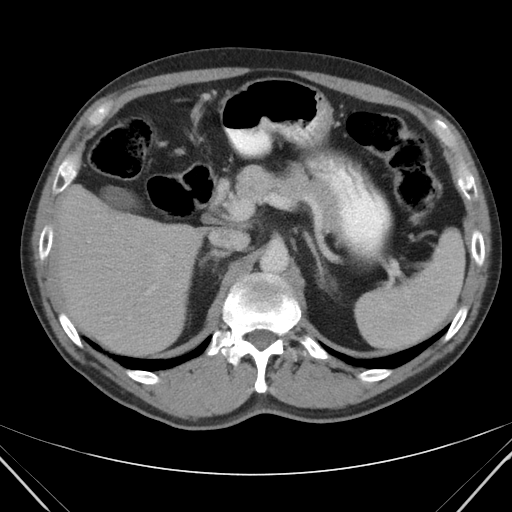
[im 69/92  lung]
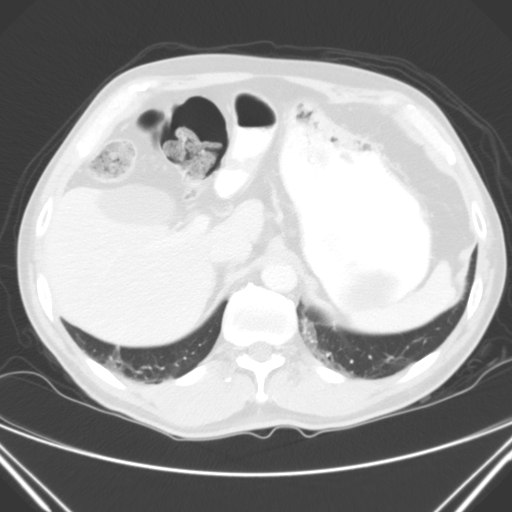
[im 74/92  soft-tissue]
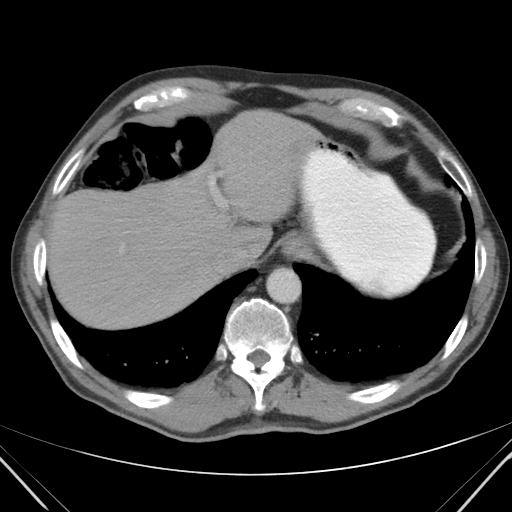
[im 74/92  lung]
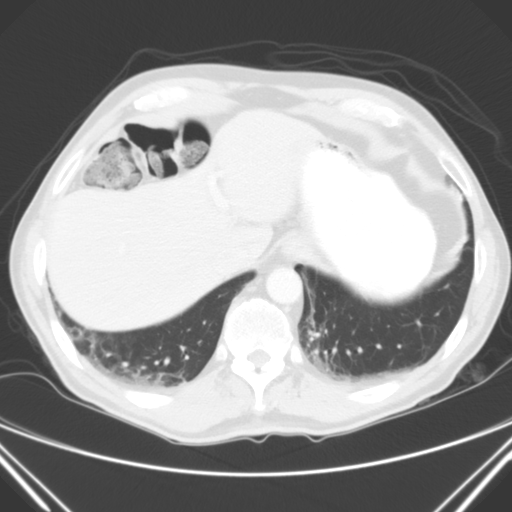
[im 80/92  lung]
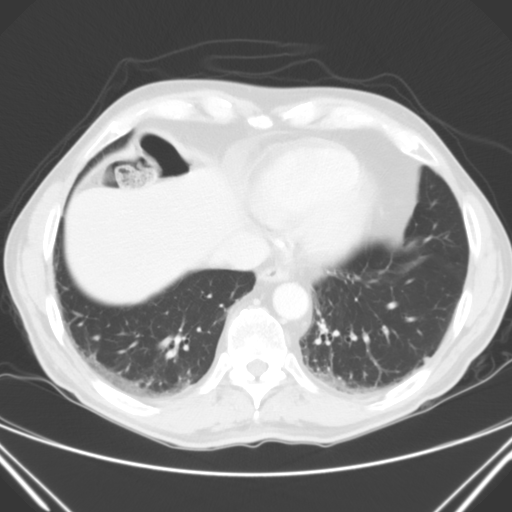
[im 86/92  soft-tissue]
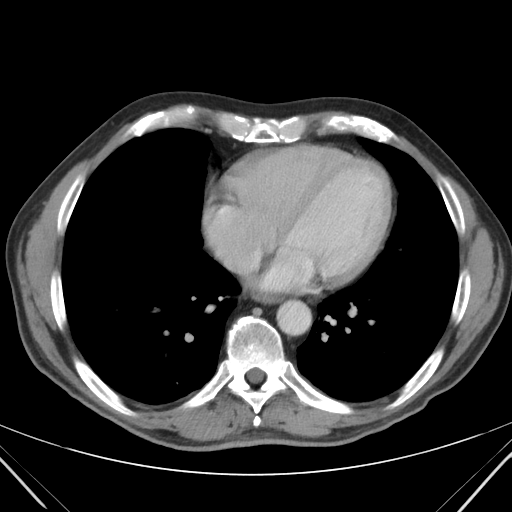
[im 86/92  lung]
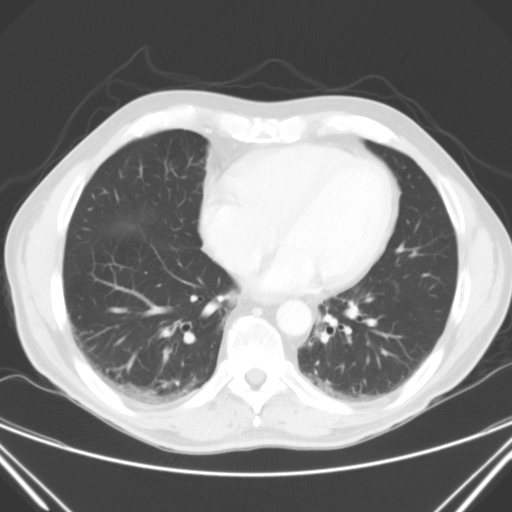
[im 86/92  bone]
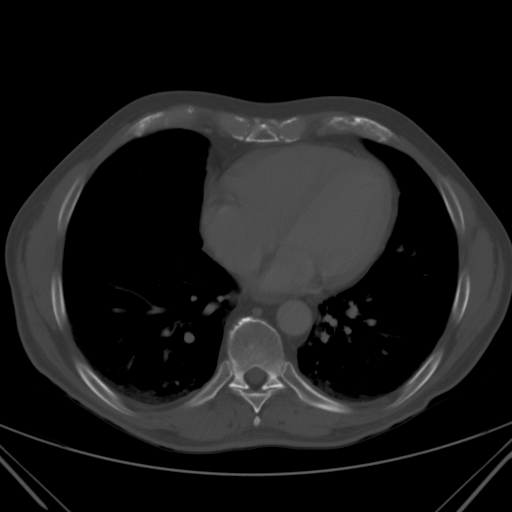

[mpr, coronals, coronal · coronal · 0.89mm/px · 3 of 102 slices shown]
[im 26/102  soft-tissue]
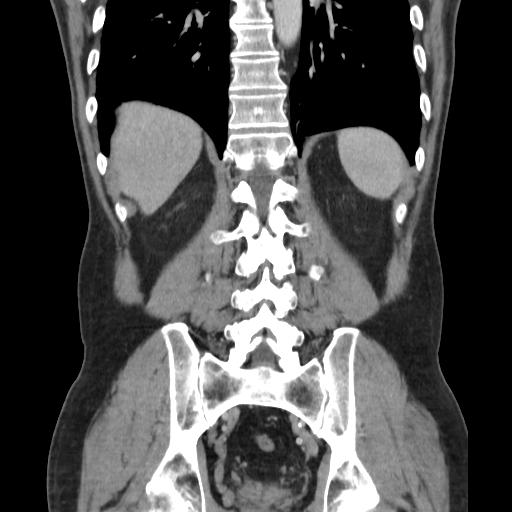
[im 51/102  soft-tissue]
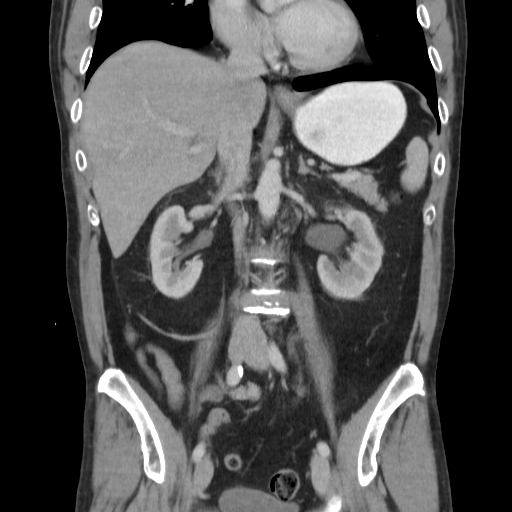
[im 76/102  soft-tissue]
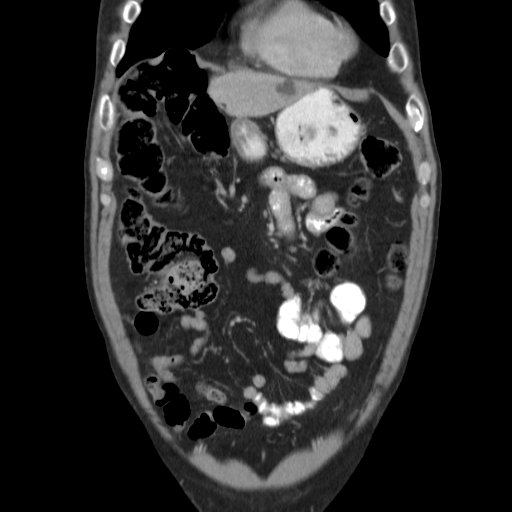

[14 of 46 positions shown; findings below may reference images not displayed]

FINDINGS: Mild bibasilar atelectasis is noted. There is mild bronchiectasis at
the left lung base.

Hypodensities within the liver, measuring up to 2.2 cm in size, are
thought to most likely reflect cysts, though they remain nonspecific
in appearance. The liver is otherwise unremarkable in appearance.
The spleen is within normal limits. The gallbladder is unremarkable.
The pancreas and adrenal glands are normal in appearance.

There is mild to moderate left-sided hydronephrosis, with diffuse
prominence of the left ureter along its entire course. Mild
asymmetric left-sided perinephric stranding is noted. An obstructing
7 x 5 mm stone is noted within the distal left ureter, just proximal
to the left vesicoureteral junction. A few right renal cysts are
seen, two of which are parapelvic in nature. Nonspecific perinephric
stranding is noted bilaterally. The right kidney is otherwise
unremarkable in appearance.

No free fluid is identified. The small bowel is unremarkable in
appearance. The stomach is within normal limits. No acute vascular
abnormalities are seen. Mild scattered calcification is noted along
the abdominal aorta and its branches.

The appendix is normal in caliber, without evidence for
appendicitis. There is interposition of the hepatic flexure of the
colon anterior to the liver. Apparent wall thickening at the distal
descending and proximal sigmoid colon is thought to reflect relative
decompression. The colon is unremarkable in appearance.

The bladder is mildly distended and grossly unremarkable in
appearance. The prostate is mildly enlarged, measuring 5.0 cm in
transverse dimension. No inguinal lymphadenopathy is seen.

No acute osseous abnormalities are identified.
IMPRESSION: 1. Mild to moderate left-sided hydronephrosis, with diffuse
prominence of the left ureter. Obstructing 7 x 5 mm stone noted in
the distal left ureter, just proximal to the left vesicoureteral
junction.
2. Scattered right renal cysts noted.
3. Likely hepatic cysts seen.
4. Mildly enlarged prostate.
5. Mild bibasilar atelectasis noted; mild bronchiectasis at the left
lung base.

## 2014-12-03 DIAGNOSIS — L821 Other seborrheic keratosis: Secondary | ICD-10-CM | POA: Diagnosis not present

## 2014-12-03 DIAGNOSIS — L570 Actinic keratosis: Secondary | ICD-10-CM | POA: Diagnosis not present

## 2014-12-03 DIAGNOSIS — D1801 Hemangioma of skin and subcutaneous tissue: Secondary | ICD-10-CM | POA: Diagnosis not present

## 2014-12-03 DIAGNOSIS — Z08 Encounter for follow-up examination after completed treatment for malignant neoplasm: Secondary | ICD-10-CM | POA: Diagnosis not present

## 2014-12-03 DIAGNOSIS — Z85820 Personal history of malignant melanoma of skin: Secondary | ICD-10-CM | POA: Diagnosis not present

## 2015-01-27 DIAGNOSIS — I10 Essential (primary) hypertension: Secondary | ICD-10-CM | POA: Diagnosis not present

## 2015-01-27 DIAGNOSIS — E780 Pure hypercholesterolemia: Secondary | ICD-10-CM | POA: Diagnosis not present

## 2015-01-27 DIAGNOSIS — Z1389 Encounter for screening for other disorder: Secondary | ICD-10-CM | POA: Diagnosis not present

## 2015-01-27 DIAGNOSIS — Z0000 Encounter for general adult medical examination without abnormal findings: Secondary | ICD-10-CM | POA: Diagnosis not present

## 2015-01-27 DIAGNOSIS — Z23 Encounter for immunization: Secondary | ICD-10-CM | POA: Diagnosis not present

## 2015-01-27 DIAGNOSIS — Z85820 Personal history of malignant melanoma of skin: Secondary | ICD-10-CM | POA: Diagnosis not present

## 2015-02-25 DIAGNOSIS — H26492 Other secondary cataract, left eye: Secondary | ICD-10-CM | POA: Diagnosis not present

## 2015-02-25 DIAGNOSIS — H524 Presbyopia: Secondary | ICD-10-CM | POA: Diagnosis not present

## 2015-02-25 DIAGNOSIS — H04123 Dry eye syndrome of bilateral lacrimal glands: Secondary | ICD-10-CM | POA: Diagnosis not present

## 2015-03-25 DIAGNOSIS — N200 Calculus of kidney: Secondary | ICD-10-CM | POA: Diagnosis not present

## 2015-03-25 DIAGNOSIS — N281 Cyst of kidney, acquired: Secondary | ICD-10-CM | POA: Diagnosis not present

## 2015-04-10 DIAGNOSIS — M7710 Lateral epicondylitis, unspecified elbow: Secondary | ICD-10-CM | POA: Diagnosis not present

## 2015-05-01 DIAGNOSIS — H903 Sensorineural hearing loss, bilateral: Secondary | ICD-10-CM | POA: Diagnosis not present

## 2015-10-05 DIAGNOSIS — M545 Low back pain: Secondary | ICD-10-CM | POA: Diagnosis not present

## 2015-12-24 DIAGNOSIS — L821 Other seborrheic keratosis: Secondary | ICD-10-CM | POA: Diagnosis not present

## 2015-12-24 DIAGNOSIS — D1801 Hemangioma of skin and subcutaneous tissue: Secondary | ICD-10-CM | POA: Diagnosis not present

## 2015-12-24 DIAGNOSIS — Z85820 Personal history of malignant melanoma of skin: Secondary | ICD-10-CM | POA: Diagnosis not present

## 2015-12-24 DIAGNOSIS — L814 Other melanin hyperpigmentation: Secondary | ICD-10-CM | POA: Diagnosis not present

## 2015-12-24 DIAGNOSIS — D235 Other benign neoplasm of skin of trunk: Secondary | ICD-10-CM | POA: Diagnosis not present

## 2015-12-24 DIAGNOSIS — L820 Inflamed seborrheic keratosis: Secondary | ICD-10-CM | POA: Diagnosis not present

## 2015-12-29 DIAGNOSIS — H903 Sensorineural hearing loss, bilateral: Secondary | ICD-10-CM | POA: Diagnosis not present

## 2015-12-29 DIAGNOSIS — H6121 Impacted cerumen, right ear: Secondary | ICD-10-CM | POA: Diagnosis not present

## 2016-01-28 DIAGNOSIS — G8929 Other chronic pain: Secondary | ICD-10-CM | POA: Diagnosis not present

## 2016-01-28 DIAGNOSIS — Z0000 Encounter for general adult medical examination without abnormal findings: Secondary | ICD-10-CM | POA: Diagnosis not present

## 2016-01-28 DIAGNOSIS — Z23 Encounter for immunization: Secondary | ICD-10-CM | POA: Diagnosis not present

## 2016-01-28 DIAGNOSIS — Z7189 Other specified counseling: Secondary | ICD-10-CM | POA: Diagnosis not present

## 2016-01-28 DIAGNOSIS — I10 Essential (primary) hypertension: Secondary | ICD-10-CM | POA: Diagnosis not present

## 2016-01-28 DIAGNOSIS — Z1389 Encounter for screening for other disorder: Secondary | ICD-10-CM | POA: Diagnosis not present

## 2016-01-28 DIAGNOSIS — E7800 Pure hypercholesterolemia, unspecified: Secondary | ICD-10-CM | POA: Diagnosis not present

## 2016-01-28 DIAGNOSIS — Z1211 Encounter for screening for malignant neoplasm of colon: Secondary | ICD-10-CM | POA: Diagnosis not present

## 2016-01-28 DIAGNOSIS — M25511 Pain in right shoulder: Secondary | ICD-10-CM | POA: Diagnosis not present

## 2016-02-29 DIAGNOSIS — H524 Presbyopia: Secondary | ICD-10-CM | POA: Diagnosis not present

## 2016-02-29 DIAGNOSIS — H26492 Other secondary cataract, left eye: Secondary | ICD-10-CM | POA: Diagnosis not present

## 2016-02-29 DIAGNOSIS — H04123 Dry eye syndrome of bilateral lacrimal glands: Secondary | ICD-10-CM | POA: Diagnosis not present

## 2016-03-15 DIAGNOSIS — Z800 Family history of malignant neoplasm of digestive organs: Secondary | ICD-10-CM | POA: Diagnosis not present

## 2016-03-15 DIAGNOSIS — Z1211 Encounter for screening for malignant neoplasm of colon: Secondary | ICD-10-CM | POA: Diagnosis not present

## 2016-04-22 DIAGNOSIS — Z800 Family history of malignant neoplasm of digestive organs: Secondary | ICD-10-CM | POA: Diagnosis not present

## 2016-04-22 DIAGNOSIS — Z1211 Encounter for screening for malignant neoplasm of colon: Secondary | ICD-10-CM | POA: Diagnosis not present

## 2016-05-04 DIAGNOSIS — E7800 Pure hypercholesterolemia, unspecified: Secondary | ICD-10-CM | POA: Diagnosis not present

## 2016-05-06 DIAGNOSIS — Z23 Encounter for immunization: Secondary | ICD-10-CM | POA: Diagnosis not present

## 2016-05-06 DIAGNOSIS — R030 Elevated blood-pressure reading, without diagnosis of hypertension: Secondary | ICD-10-CM | POA: Diagnosis not present

## 2016-05-06 DIAGNOSIS — E7800 Pure hypercholesterolemia, unspecified: Secondary | ICD-10-CM | POA: Diagnosis not present

## 2016-05-06 DIAGNOSIS — Z5181 Encounter for therapeutic drug level monitoring: Secondary | ICD-10-CM | POA: Diagnosis not present

## 2016-08-08 DIAGNOSIS — H6123 Impacted cerumen, bilateral: Secondary | ICD-10-CM | POA: Diagnosis not present

## 2016-08-08 DIAGNOSIS — H6121 Impacted cerumen, right ear: Secondary | ICD-10-CM | POA: Insufficient documentation

## 2016-08-30 DIAGNOSIS — H903 Sensorineural hearing loss, bilateral: Secondary | ICD-10-CM | POA: Diagnosis not present

## 2016-08-31 DIAGNOSIS — E7800 Pure hypercholesterolemia, unspecified: Secondary | ICD-10-CM | POA: Diagnosis not present

## 2016-08-31 DIAGNOSIS — I10 Essential (primary) hypertension: Secondary | ICD-10-CM | POA: Diagnosis not present

## 2016-08-31 DIAGNOSIS — Z5181 Encounter for therapeutic drug level monitoring: Secondary | ICD-10-CM | POA: Diagnosis not present

## 2016-12-22 DIAGNOSIS — L814 Other melanin hyperpigmentation: Secondary | ICD-10-CM | POA: Diagnosis not present

## 2016-12-22 DIAGNOSIS — D225 Melanocytic nevi of trunk: Secondary | ICD-10-CM | POA: Diagnosis not present

## 2016-12-22 DIAGNOSIS — Z85820 Personal history of malignant melanoma of skin: Secondary | ICD-10-CM | POA: Diagnosis not present

## 2016-12-22 DIAGNOSIS — L821 Other seborrheic keratosis: Secondary | ICD-10-CM | POA: Diagnosis not present

## 2017-01-27 DIAGNOSIS — Z1389 Encounter for screening for other disorder: Secondary | ICD-10-CM | POA: Diagnosis not present

## 2017-01-27 DIAGNOSIS — Z0000 Encounter for general adult medical examination without abnormal findings: Secondary | ICD-10-CM | POA: Diagnosis not present

## 2017-01-30 DIAGNOSIS — E7800 Pure hypercholesterolemia, unspecified: Secondary | ICD-10-CM | POA: Diagnosis not present

## 2017-01-30 DIAGNOSIS — Z8679 Personal history of other diseases of the circulatory system: Secondary | ICD-10-CM | POA: Diagnosis not present

## 2017-02-21 DIAGNOSIS — H6123 Impacted cerumen, bilateral: Secondary | ICD-10-CM | POA: Diagnosis not present

## 2017-03-07 DIAGNOSIS — H40013 Open angle with borderline findings, low risk, bilateral: Secondary | ICD-10-CM | POA: Diagnosis not present

## 2017-03-07 DIAGNOSIS — H04123 Dry eye syndrome of bilateral lacrimal glands: Secondary | ICD-10-CM | POA: Diagnosis not present

## 2017-03-07 DIAGNOSIS — H5213 Myopia, bilateral: Secondary | ICD-10-CM | POA: Diagnosis not present

## 2017-03-07 DIAGNOSIS — H524 Presbyopia: Secondary | ICD-10-CM | POA: Diagnosis not present

## 2017-03-07 DIAGNOSIS — H52223 Regular astigmatism, bilateral: Secondary | ICD-10-CM | POA: Diagnosis not present

## 2017-03-07 DIAGNOSIS — H26492 Other secondary cataract, left eye: Secondary | ICD-10-CM | POA: Diagnosis not present

## 2017-03-23 DIAGNOSIS — R69 Illness, unspecified: Secondary | ICD-10-CM | POA: Diagnosis not present

## 2017-03-28 DIAGNOSIS — K006 Disturbances in tooth eruption: Secondary | ICD-10-CM | POA: Diagnosis not present

## 2017-03-28 DIAGNOSIS — R69 Illness, unspecified: Secondary | ICD-10-CM | POA: Diagnosis not present

## 2017-06-09 DIAGNOSIS — Z23 Encounter for immunization: Secondary | ICD-10-CM | POA: Diagnosis not present

## 2017-06-09 DIAGNOSIS — R200 Anesthesia of skin: Secondary | ICD-10-CM | POA: Diagnosis not present

## 2017-07-25 DIAGNOSIS — R69 Illness, unspecified: Secondary | ICD-10-CM | POA: Diagnosis not present

## 2017-09-19 DIAGNOSIS — Z0100 Encounter for examination of eyes and vision without abnormal findings: Secondary | ICD-10-CM | POA: Diagnosis not present

## 2017-10-03 DIAGNOSIS — H6121 Impacted cerumen, right ear: Secondary | ICD-10-CM | POA: Diagnosis not present

## 2017-12-21 DIAGNOSIS — L821 Other seborrheic keratosis: Secondary | ICD-10-CM | POA: Diagnosis not present

## 2017-12-21 DIAGNOSIS — L814 Other melanin hyperpigmentation: Secondary | ICD-10-CM | POA: Diagnosis not present

## 2017-12-21 DIAGNOSIS — D229 Melanocytic nevi, unspecified: Secondary | ICD-10-CM | POA: Diagnosis not present

## 2017-12-21 DIAGNOSIS — D1801 Hemangioma of skin and subcutaneous tissue: Secondary | ICD-10-CM | POA: Diagnosis not present

## 2017-12-21 DIAGNOSIS — Z85820 Personal history of malignant melanoma of skin: Secondary | ICD-10-CM | POA: Diagnosis not present

## 2018-01-30 DIAGNOSIS — Z0000 Encounter for general adult medical examination without abnormal findings: Secondary | ICD-10-CM | POA: Diagnosis not present

## 2018-01-30 DIAGNOSIS — I10 Essential (primary) hypertension: Secondary | ICD-10-CM | POA: Diagnosis not present

## 2018-01-30 DIAGNOSIS — Z1389 Encounter for screening for other disorder: Secondary | ICD-10-CM | POA: Diagnosis not present

## 2018-01-30 DIAGNOSIS — Z85820 Personal history of malignant melanoma of skin: Secondary | ICD-10-CM | POA: Diagnosis not present

## 2018-01-30 DIAGNOSIS — E7800 Pure hypercholesterolemia, unspecified: Secondary | ICD-10-CM | POA: Diagnosis not present

## 2018-03-12 DIAGNOSIS — H26493 Other secondary cataract, bilateral: Secondary | ICD-10-CM | POA: Diagnosis not present

## 2018-03-12 DIAGNOSIS — H40013 Open angle with borderline findings, low risk, bilateral: Secondary | ICD-10-CM | POA: Diagnosis not present

## 2018-03-12 DIAGNOSIS — H524 Presbyopia: Secondary | ICD-10-CM | POA: Diagnosis not present

## 2018-03-12 DIAGNOSIS — H04123 Dry eye syndrome of bilateral lacrimal glands: Secondary | ICD-10-CM | POA: Diagnosis not present

## 2018-03-20 DIAGNOSIS — S86112A Strain of other muscle(s) and tendon(s) of posterior muscle group at lower leg level, left leg, initial encounter: Secondary | ICD-10-CM | POA: Diagnosis not present

## 2018-08-16 DIAGNOSIS — H6121 Impacted cerumen, right ear: Secondary | ICD-10-CM | POA: Diagnosis not present

## 2018-10-01 DIAGNOSIS — H40013 Open angle with borderline findings, low risk, bilateral: Secondary | ICD-10-CM | POA: Diagnosis not present

## 2018-10-01 DIAGNOSIS — Z961 Presence of intraocular lens: Secondary | ICD-10-CM | POA: Diagnosis not present

## 2018-10-01 DIAGNOSIS — H26493 Other secondary cataract, bilateral: Secondary | ICD-10-CM | POA: Diagnosis not present

## 2018-10-01 DIAGNOSIS — H04123 Dry eye syndrome of bilateral lacrimal glands: Secondary | ICD-10-CM | POA: Diagnosis not present

## 2019-02-04 DIAGNOSIS — Z1389 Encounter for screening for other disorder: Secondary | ICD-10-CM | POA: Diagnosis not present

## 2019-02-04 DIAGNOSIS — Z0000 Encounter for general adult medical examination without abnormal findings: Secondary | ICD-10-CM | POA: Diagnosis not present

## 2019-02-20 DIAGNOSIS — E7800 Pure hypercholesterolemia, unspecified: Secondary | ICD-10-CM | POA: Diagnosis not present

## 2019-02-20 DIAGNOSIS — Z85820 Personal history of malignant melanoma of skin: Secondary | ICD-10-CM | POA: Diagnosis not present

## 2019-02-20 DIAGNOSIS — R413 Other amnesia: Secondary | ICD-10-CM | POA: Diagnosis not present

## 2019-02-20 DIAGNOSIS — Z8679 Personal history of other diseases of the circulatory system: Secondary | ICD-10-CM | POA: Diagnosis not present

## 2019-07-18 DIAGNOSIS — R42 Dizziness and giddiness: Secondary | ICD-10-CM | POA: Diagnosis not present

## 2019-09-20 DIAGNOSIS — L720 Epidermal cyst: Secondary | ICD-10-CM | POA: Diagnosis not present

## 2019-09-27 ENCOUNTER — Ambulatory Visit: Payer: Medicare PPO

## 2019-10-03 ENCOUNTER — Ambulatory Visit: Payer: Medicare HMO | Attending: Internal Medicine

## 2019-10-03 DIAGNOSIS — Z23 Encounter for immunization: Secondary | ICD-10-CM | POA: Insufficient documentation

## 2019-10-03 NOTE — Progress Notes (Signed)
   Covid-19 Vaccination Clinic  Name:  Alex Nguyen    MRN: [id number] DOB: 05-29-1946  10/03/2019  Alex Nguyen was observed post Covid-19 immunization for 15 minutes without incidence. He was provided with Vaccine Information Sheet and instruction to access the V-Safe system.   Alex Nguyen was instructed to call 911 with any severe reactions post vaccine: Marland Kitchen Difficulty breathing  . Swelling of your face and throat  . A fast heartbeat  . A bad rash all over your body  . Dizziness and weakness    Immunizations Administered    Name Date Dose VIS Date Route   Pfizer COVID-19 Vaccine 10/03/2019 11:51 AM 0.3 mL 08/09/2019 Intramuscular   Manufacturer: [place]   Lot: [id number]   [place]: [id number]

## 2019-10-04 DIAGNOSIS — L720 Epidermal cyst: Secondary | ICD-10-CM | POA: Diagnosis not present

## 2019-10-14 ENCOUNTER — Ambulatory Visit: Payer: Medicare PPO

## 2019-10-29 ENCOUNTER — Ambulatory Visit: Payer: Medicare HMO | Attending: Internal Medicine

## 2019-10-29 DIAGNOSIS — Z23 Encounter for immunization: Secondary | ICD-10-CM | POA: Insufficient documentation

## 2019-10-29 NOTE — Progress Notes (Signed)
   Covid-19 Vaccination Clinic  Name:  Alex Nguyen    MRN: [id number] DOB: 03/20/1946  10/29/2019  [name] was observed post Covid-19 immunization for 15 minutes without incident. He was provided with Vaccine Information Sheet and instruction to access the V-Safe system.   [name] was instructed to call 911 with any severe reactions post vaccine: Marland Kitchen Difficulty breathing  . Swelling of face and throat  . A fast heartbeat  . A bad rash all over body  . Dizziness and weakness   Immunizations Administered    Name Date Dose VIS Date Route   Pfizer COVID-19 Vaccine 10/29/2019  8:11 AM 0.3 mL 08/09/2019 Intramuscular   Manufacturer: [place]   Lot: [id number]   [place]: [id number]

## 2019-11-07 DIAGNOSIS — L729 Follicular cyst of the skin and subcutaneous tissue, unspecified: Secondary | ICD-10-CM | POA: Diagnosis not present

## 2019-12-05 ENCOUNTER — Ambulatory Visit: Payer: Self-pay | Admitting: General Surgery

## 2019-12-05 DIAGNOSIS — L723 Sebaceous cyst: Secondary | ICD-10-CM | POA: Diagnosis not present

## 2019-12-05 NOTE — H&P (View-Only) (Signed)
Alex Nguyen Documented: 12/05/2019 9:25 AM Location: [facility] Patient #: [id number] DOB: 18-Sep-1945 Widowed / Language: [name] / Race: White Male  History of Present Illness Alex Hiss M. Alex Nguyen; 12/05/2019 10:19 AM) The patient is a 74 year old male who presents with an epidermal cyst. He is referred by Dr. Laurann Montana for an evaluation of a cyst on the left buttock area. I reviewed the referring provider's note from March 11. He states around December 12 he noticed an area of swelling on his butt cheek. It was tender and uncomfortable. He immediately started placing warm compresses without much improvement. He ended up seeing his doctor on January 22 and was prescribed doxycycline and a topical antibiotic any started covering the area with gauze. He really didn't make much difference either. However in mid March he removed because one day and there is bloody drainage on the area. He was concerned about a bacterial infection. He subsequently developed some discomfort in his tailbone when he sits down. He believes the area is improved in the sense that the swelling around the boil has significantly improved but he is concerned about a recurrent infection and recurrent swelling. He is generally very active and has been a jogger for 29 years but decreased his physical activity during the pandemic. He states that the area was about the size of a marble or little bit larger at its biggest but now it's very small. He is interested in excision in order to prevent recurrence and potential future infection. He denies any chest pain, chest pressure, source of breath, TIAs or amaurosis fugax. He does not smoke.   Problem List/Past Medical Alex Hiss M. Redmond Pulling, Nguyen; 12/05/2019 10:19 AM) Alex Nguyen CYST (L72.3)  Past Surgical History Alex Nguyen, CMA; 12/05/2019 9:25 AM) Cataract Surgery Bilateral. Knee Surgery Right.  Diagnostic Studies History Alex Nguyen, CMA; 12/05/2019 9:25  AM) Colonoscopy 1-5 years ago  Allergies Alex Nguyen, CMA; 12/05/2019 9:25 AM) No Known Drug Allergies [12/05/2019]: Allergies Reconciled  Medication History Alex Nguyen, CMA; 12/05/2019 9:26 AM) Atorvastatin Calcium (10MG  Tablet, Oral) Active. Mupirocin (2% Ointment, External) Active. Medications Reconciled  Social History Alex Nguyen, CMA; 12/05/2019 9:25 AM) Alcohol use Occasional alcohol use. Caffeine use Coffee, Tea. Illicit drug use Remotely quit drug use. Tobacco use Never smoker.  Family History Alex Nguyen, [place]; 12/05/2019 9:25 AM) Colon Cancer Mother. Heart Disease Father.  Other Problems Alex Hiss M. Redmond Pulling, Nguyen; 12/05/2019 10:19 AM) Back Pain Hemorrhoids High blood pressure Hypercholesterolemia Kidney Stone Melanoma     Review of Systems Alex Nguyen CMA; 12/05/2019 9:25 AM) General Not Present- Appetite Loss, Chills, Fatigue, Fever, Night Sweats, Weight Gain and Weight Loss. Skin Not Present- Change in Wart/Mole, Dryness, Hives, Jaundice, New Lesions, Non-Healing Wounds, Rash and Ulcer. HEENT Present- Hearing Loss, Ringing in the Ears and Wears glasses/contact lenses. Not Present- Earache, Hoarseness, Nose Bleed, Oral Ulcers, Seasonal Allergies, Sinus Pain, Sore Throat, Visual Disturbances and Yellow Eyes. Respiratory Not Present- Bloody sputum, Chronic Cough, Difficulty Breathing, Snoring and Wheezing. Cardiovascular Not Present- Chest Pain, Difficulty Breathing Lying Down, Leg Cramps, Palpitations, Rapid Heart Rate, Shortness of Breath and Swelling of Extremities. Gastrointestinal Not Present- Abdominal Pain, Bloating, Bloody Stool, Change in Bowel Habits, Chronic diarrhea, Constipation, Difficulty Swallowing, Excessive gas, Gets full quickly at meals, Hemorrhoids, Indigestion, Nausea, Rectal Pain and Vomiting. Male Genitourinary Present- Change in Urinary Stream. Not Present- Blood in Urine, Frequency, Impotence, Nocturia, Painful Urination,  Urgency and Urine Leakage. Musculoskeletal Not Present- Back Pain, Joint Pain, Joint Stiffness, Muscle Pain, Muscle Weakness  and Swelling of Extremities. Neurological Present- Tingling. Not Present- Decreased Memory, Fainting, Headaches, Numbness, Seizures, Tremor, Trouble walking and Weakness. Psychiatric Not Present- Anxiety, Bipolar, Change in Sleep Pattern, Depression, Fearful and Frequent crying. Endocrine Not Present- Cold Intolerance, Excessive Hunger, Hair Changes, Heat Intolerance, Hot flashes and New Diabetes. Hematology Not Present- Blood Thinners, Easy Bruising, Excessive bleeding, Gland problems, HIV and Persistent Infections.  Vitals Alex Nguyen CMA; 12/05/2019 9:26 AM) 12/05/2019 9:26 AM Weight: 176.2 lb Height: 75in Body Surface Area: 2.08 m Body Mass Index: 22.02 kg/m  Temp.: 98.18F(Tympanic)  Pulse: 94 (Regular)  BP: 130/72 (Sitting, Left Arm, Standard)        Physical Exam Alex Hiss M. Alex Silos Nguyen; 12/05/2019 10:17 AM)  General Mental Status-Alert. General Appearance-Consistent with stated age. Hydration-Well hydrated. Voice-Normal.  Integumentary Note: Right buttock reveals a small sebaceous cyst with a pustule with no surrounding cellulitis or induration. Lesions proximally about 1.5 cm x 2 cm. No surrounding soft tissue issues.  Head and Neck Head-normocephalic, atraumatic with no lesions or palpable masses. Trachea-midline. Thyroid Gland Characteristics - normal size and consistency.  Eye Eyeball - Bilateral-Extraocular movements intact. Sclera/Conjunctiva - Bilateral-No scleral icterus.  Chest and Lung Exam Chest and lung exam reveals -quiet, even and easy respiratory effort with no use of accessory muscles and on auscultation, normal breath sounds, no adventitious sounds and normal vocal resonance. Inspection Chest Wall - Normal. Back - normal.  Breast - Did not examine.  Cardiovascular Cardiovascular examination  reveals -normal heart sounds, regular rate and rhythm with no murmurs and normal pedal pulses bilaterally.  Abdomen Inspection Inspection of the abdomen reveals - No Hernias. Skin - Scar - no surgical scars. Palpation/Percussion Palpation and Percussion of the abdomen reveal - Soft, Non Tender, No Rebound tenderness, No Rigidity (guarding) and No hepatosplenomegaly. Auscultation Auscultation of the abdomen reveals - Bowel sounds normal.  Peripheral Vascular Upper Extremity Palpation - Pulses bilaterally normal.  Neurologic Neurologic evaluation reveals -alert and oriented x 3 with no impairment of recent or remote memory. Mental Status-Normal.  Neuropsychiatric The patient's mood and affect are described as -normal. Judgment and Insight-insight is appropriate concerning matters relevant to self.  Musculoskeletal Normal Exam - Left-Upper Extremity Strength Normal and Lower Extremity Strength Normal. Normal Exam - Right-Upper Extremity Strength Normal and Lower Extremity Strength Normal.  Lymphatic Head & Neck  General Head & Neck Lymphatics: Bilateral - Description - Normal. Axillary - Did not examine. Femoral & Inguinal - Did not examine.    Assessment & Plan Alex Hiss M. Tinsley Lomas Nguyen; 12/05/2019 10:16 AM)  SEBACEOUS CYST (L72.3) Impression: We discussed the etiology and management of sebaceous cysts (epidermoid inclusion cysts). The patient was given educational material. We discussed that these lesions can become infected at times. We discussed the signs and symptoms of infection. We discussed the only way to eliminate these lesions is to surgically excise the cyst and its wall in its entirety.  We discussed observation versus surgical excision. We discussed the risks and benefits of surgery including but not limited to bleeding, infection, injury to surrounding structures, scarring, cosmetic concerns, possible recurrence, and the typical postoperative course.  We  discussed the performing of procedure under local analgesia versus conscious sedation. The patient would prefer local sedation I think given the small size of the area that is okay  The patient has elected to proceed with excision of buttock sebaceous cyst under local only.  This is a low complex problem requiring low level of medical decision making.  Current Plans Written instructions  provided You are being scheduled for surgery- Our schedulers will call you.  You should hear from our office's scheduling department within 5 working days about the location, date, and time of surgery. We try to make accommodations for patient's preferences in scheduling surgery, but sometimes the OR schedule or the surgeon's schedule prevents [place] from making those accommodations.  If you have not heard from our office [phone]) in 5 working days, call the office and ask for your surgeon's nurse.  If you have other questions about your diagnosis, plan, or surgery, call the office and ask for your surgeon's nurse.  Leighton Ruff. Redmond Pulling, Nguyen, FACS General, Bariatric, & Minimally Invasive Surgery [facility], [place]

## 2019-12-05 NOTE — H&P (Signed)
Lajean Manes Documented: 12/05/2019 9:25 AM Location: [facility] Patient #: [id number] DOB: 1945/10/06 Widowed / Language: [name] / Race: White Male  History of Present Illness Randall Hiss M. Sharee Sturdy MD; 12/05/2019 10:19 AM) The patient is a 74 year old male who presents with an epidermal cyst. He is referred by Dr. Laurann Montana for an evaluation of a cyst on the left buttock area. I reviewed the referring provider's note from March 11. He states around December 12 he noticed an area of swelling on his butt cheek. It was tender and uncomfortable. He immediately started placing warm compresses without much improvement. He ended up seeing his doctor on January 22 and was prescribed doxycycline and a topical antibiotic any started covering the area with gauze. He really didn't make much difference either. However in mid March he removed because one day and there is bloody drainage on the area. He was concerned about a bacterial infection. He subsequently developed some discomfort in his tailbone when he sits down. He believes the area is improved in the sense that the swelling around the boil has significantly improved but he is concerned about a recurrent infection and recurrent swelling. He is generally very active and has been a jogger for 29 years but decreased his physical activity during the pandemic. He states that the area was about the size of a marble or little bit larger at its biggest but now it's very small. He is interested in excision in order to prevent recurrence and potential future infection. He denies any chest pain, chest pressure, source of breath, TIAs or amaurosis fugax. He does not smoke.   Problem List/Past Medical [name], MD; 12/05/2019 10:19 AM) [name]  Past Surgical History Sabino Gasser, CMA; 12/05/2019 9:25 AM) Cataract Surgery Bilateral. Knee Surgery Right.  Diagnostic Studies History Sabino Gasser, CMA; 12/05/2019 9:25  AM) Colonoscopy 1-5 years ago  Allergies Sabino Gasser, CMA; 12/05/2019 9:25 AM) No Known Drug Allergies [12/05/2019]: Allergies Reconciled  Medication History Sabino Gasser, CMA; 12/05/2019 9:26 AM) Atorvastatin Calcium (10MG  Tablet, Oral) Active. Mupirocin (2% Ointment, External) Active. Medications Reconciled  Social History Sabino Gasser, CMA; 12/05/2019 9:25 AM) Alcohol use Occasional alcohol use. Caffeine use Coffee, Tea. Illicit drug use Remotely quit drug use. Tobacco use Never smoker.  Family History Sabino Gasser, [place]; 12/05/2019 9:25 AM) Colon Cancer Mother. Heart Disease Father.  Other Problems [name], MD; 12/05/2019 10:19 AM) Back Pain Hemorrhoids High blood pressure Hypercholesterolemia Kidney Stone Melanoma     Review of Systems Sabino Gasser CMA; 12/05/2019 9:25 AM) General Not Present- Appetite Loss, Chills, Fatigue, Fever, Night Sweats, Weight Gain and Weight Loss. Skin Not Present- Change in Wart/Mole, Dryness, Hives, Jaundice, New Lesions, Non-Healing Wounds, Rash and Ulcer. HEENT Present- Hearing Loss, Ringing in the Ears and Wears glasses/contact lenses. Not Present- Earache, Hoarseness, Nose Bleed, Oral Ulcers, Seasonal Allergies, Sinus Pain, Sore Throat, Visual Disturbances and Yellow Eyes. Respiratory Not Present- Bloody sputum, Chronic Cough, Difficulty Breathing, Snoring and Wheezing. Cardiovascular Not Present- Chest Pain, Difficulty Breathing Lying Down, Leg Cramps, Palpitations, Rapid Heart Rate, Shortness of Breath and Swelling of Extremities. Gastrointestinal Not Present- Abdominal Pain, Bloating, Bloody Stool, Change in Bowel Habits, Chronic diarrhea, Constipation, Difficulty Swallowing, Excessive gas, Gets full quickly at meals, Hemorrhoids, Indigestion, Nausea, Rectal Pain and Vomiting. Male Genitourinary Present- Change in Urinary Stream. Not Present- Blood in Urine, Frequency, Impotence, Nocturia, Painful Urination,  Urgency and Urine Leakage. Musculoskeletal Not Present- Back Pain, Joint Pain, Joint Stiffness, Muscle Pain, Muscle Weakness  and Swelling of Extremities. Neurological Present- Tingling. Not Present- Decreased Memory, Fainting, Headaches, Numbness, Seizures, Tremor, Trouble walking and Weakness. Psychiatric Not Present- Anxiety, Bipolar, Change in Sleep Pattern, Depression, Fearful and Frequent crying. Endocrine Not Present- Cold Intolerance, Excessive Hunger, Hair Changes, Heat Intolerance, Hot flashes and New Diabetes. Hematology Not Present- Blood Thinners, Easy Bruising, Excessive bleeding, Gland problems, HIV and Persistent Infections.  Vitals Sabino Gasser CMA; 12/05/2019 9:26 AM) 12/05/2019 9:26 AM Weight: 176.2 lb Height: 75in Body Surface Area: 2.08 m Body Mass Index: 22.02 kg/m  Temp.: 98.67F(Tympanic)  Pulse: 94 (Regular)  BP: 130/72 (Sitting, Left Arm, Standard)        Physical Exam Randall Hiss M. Ramari Bray MD; 12/05/2019 10:17 AM)  General Mental Status-Alert. General Appearance-Consistent with stated age. Hydration-Well hydrated. Voice-Normal.  Integumentary Note: Right buttock reveals a small sebaceous cyst with a pustule with no surrounding cellulitis or induration. Lesions proximally about 1.5 cm x 2 cm. No surrounding soft tissue issues.  Head and Neck Head-normocephalic, atraumatic with no lesions or palpable masses. Trachea-midline. Thyroid Gland Characteristics - normal size and consistency.  Eye Eyeball - Bilateral-Extraocular movements intact. Sclera/Conjunctiva - Bilateral-No scleral icterus.  Chest and Lung Exam Chest and lung exam reveals -quiet, even and easy respiratory effort with no use of accessory muscles and on auscultation, normal breath sounds, no adventitious sounds and normal vocal resonance. Inspection Chest Wall - Normal. Back - normal.  Breast - Did not examine.  Cardiovascular Cardiovascular examination  reveals -normal heart sounds, regular rate and rhythm with no murmurs and normal pedal pulses bilaterally.  Abdomen Inspection Inspection of the abdomen reveals - No Hernias. Skin - Scar - no surgical scars. Palpation/Percussion Palpation and Percussion of the abdomen reveal - Soft, Non Tender, No Rebound tenderness, No Rigidity (guarding) and No hepatosplenomegaly. Auscultation Auscultation of the abdomen reveals - Bowel sounds normal.  Peripheral Vascular Upper Extremity Palpation - Pulses bilaterally normal.  Neurologic Neurologic evaluation reveals -alert and oriented x 3 with no impairment of recent or remote memory. Mental Status-Normal.  Neuropsychiatric The patient's mood and affect are described as -normal. Judgment and Insight-insight is appropriate concerning matters relevant to self.  Musculoskeletal Normal Exam - Left-Upper Extremity Strength Normal and Lower Extremity Strength Normal. Normal Exam - Right-Upper Extremity Strength Normal and Lower Extremity Strength Normal.  Lymphatic Head & Neck  General Head & Neck Lymphatics: Bilateral - Description - Normal. Axillary - Did not examine. Femoral & Inguinal - Did not examine.    Assessment & Plan Randall Hiss M. Yailin Biederman MD; 12/05/2019 10:16 AM)  SEBACEOUS CYST (L72.3) Impression: We discussed the etiology and management of sebaceous cysts (epidermoid inclusion cysts). The patient was given educational material. We discussed that these lesions can become infected at times. We discussed the signs and symptoms of infection. We discussed the only way to eliminate these lesions is to surgically excise the cyst and its wall in its entirety.  We discussed observation versus surgical excision. We discussed the risks and benefits of surgery including but not limited to bleeding, infection, injury to surrounding structures, scarring, cosmetic concerns, possible recurrence, and the typical postoperative course.  We  discussed the performing of procedure under local analgesia versus conscious sedation. The patient would prefer local sedation I think given the small size of the area that is okay  The patient has elected to proceed with excision of buttock sebaceous cyst under local only.  This is a low complex problem requiring low level of medical decision making.  Current Plans Written instructions  provided You are being scheduled for surgery- Our schedulers will call you.  You should hear from our office's scheduling department within 5 working days about the location, date, and time of surgery. We try to make accommodations for patient's preferences in scheduling surgery, but sometimes the OR schedule or the surgeon's schedule prevents [place] from making those accommodations.  If you have not heard from our office [phone]) in 5 working days, call the office and ask for your surgeon's nurse.  If you have other questions about your diagnosis, plan, or surgery, call the office and ask for your surgeon's nurse.  Leighton Ruff. Redmond Pulling, MD, FACS General, Bariatric, & Minimally Invasive Surgery [facility], [place]

## 2019-12-09 ENCOUNTER — Encounter (HOSPITAL_BASED_OUTPATIENT_CLINIC_OR_DEPARTMENT_OTHER): Payer: Self-pay | Admitting: General Surgery

## 2019-12-09 ENCOUNTER — Other Ambulatory Visit: Payer: Self-pay

## 2019-12-13 ENCOUNTER — Other Ambulatory Visit (HOSPITAL_COMMUNITY)
Admission: RE | Admit: 2019-12-13 | Discharge: 2019-12-13 | Disposition: A | Discharge status: Home / Self Care | Payer: Medicare HMO | Source: Ambulatory Visit | Attending: General Surgery | Admitting: General Surgery

## 2019-12-13 DIAGNOSIS — Z20822 Contact with and (suspected) exposure to covid-19: Secondary | ICD-10-CM | POA: Diagnosis not present

## 2019-12-13 DIAGNOSIS — Z01812 Encounter for preprocedural laboratory examination: Secondary | ICD-10-CM | POA: Insufficient documentation

## 2019-12-13 LAB — SARS CORONAVIRUS 2 (TAT 6-24 HRS): SARS Coronavirus 2: NEGATIVE

## 2019-12-16 ENCOUNTER — Encounter (HOSPITAL_BASED_OUTPATIENT_CLINIC_OR_DEPARTMENT_OTHER): Payer: Self-pay | Admitting: Certified Registered"

## 2019-12-17 ENCOUNTER — Encounter (HOSPITAL_BASED_OUTPATIENT_CLINIC_OR_DEPARTMENT_OTHER): Payer: Self-pay | Admitting: General Surgery

## 2019-12-17 ENCOUNTER — Ambulatory Visit (HOSPITAL_BASED_OUTPATIENT_CLINIC_OR_DEPARTMENT_OTHER)
Admission: RE | Admit: 2019-12-17 | Discharge: 2019-12-17 | Disposition: A | Discharge status: Home / Self Care | Payer: Medicare HMO | Attending: General Surgery | Admitting: General Surgery

## 2019-12-17 ENCOUNTER — Encounter (HOSPITAL_BASED_OUTPATIENT_CLINIC_OR_DEPARTMENT_OTHER)
Admission: RE | Disposition: A | Discharge status: Home / Self Care | Payer: Self-pay | Source: Home / Self Care | Attending: General Surgery

## 2019-12-17 DIAGNOSIS — Z85820 Personal history of malignant melanoma of skin: Secondary | ICD-10-CM | POA: Diagnosis not present

## 2019-12-17 DIAGNOSIS — L720 Epidermal cyst: Secondary | ICD-10-CM | POA: Diagnosis not present

## 2019-12-17 DIAGNOSIS — L723 Sebaceous cyst: Secondary | ICD-10-CM | POA: Diagnosis not present

## 2019-12-17 HISTORY — PX: LESION REMOVAL: SHX5196

## 2019-12-17 SURGERY — MINOR EXCISION OF LESION
Anesthesia: LOCAL | Site: Buttocks

## 2019-12-17 MED ORDER — CHLORHEXIDINE GLUCONATE CLOTH 2 % EX PADS: 6.0000 | MEDICATED_PAD | Freq: Once | CUTANEOUS | Status: DC

## 2019-12-17 MED ORDER — LIDOCAINE HCL (PF) 1 % IJ SOLN
INTRAMUSCULAR | Status: AC
  Filled 2019-12-17: qty 30

## 2019-12-17 MED ORDER — CEFAZOLIN SODIUM-DEXTROSE 2-4 GM/100ML-% IV SOLN: 2.0000 g | INTRAVENOUS | Status: DC

## 2019-12-17 MED ORDER — ENSURE PRE-SURGERY PO LIQD: 296.0000 mL | Freq: Once | ORAL | Status: DC

## 2019-12-17 MED ORDER — LIDOCAINE-EPINEPHRINE 1 %-1:100000 IJ SOLN
INTRAMUSCULAR | Status: AC
  Filled 2019-12-17: qty 1

## 2019-12-17 MED ORDER — LIDOCAINE-EPINEPHRINE 1 %-1:100000 IJ SOLN
INTRAMUSCULAR | Status: DC | PRN
  Administered 2019-12-17: 8 mL

## 2019-12-17 MED ORDER — ACETAMINOPHEN 500 MG PO TABS: 1000.0000 mg | ORAL_TABLET | Freq: Three times a day (TID) | ORAL | 0 refills | Status: AC

## 2019-12-17 MED ORDER — ACETAMINOPHEN 500 MG PO TABS: 1000.0000 mg | ORAL_TABLET | ORAL | Status: DC

## 2019-12-17 MED ORDER — SODIUM BICARBONATE 4.2 % IV SOLN
INTRAVENOUS | Status: AC
  Filled 2019-12-17: qty 10

## 2019-12-17 MED ORDER — SODIUM BICARBONATE 4 % IV SOLN
INTRAVENOUS | Status: DC | PRN
  Administered 2019-12-17: 1 mL via INTRAVENOUS

## 2019-12-17 MED ORDER — BUPIVACAINE HCL (PF) 0.25 % IJ SOLN
INTRAMUSCULAR | Status: AC
  Filled 2019-12-17: qty 30

## 2019-12-17 SURGICAL SUPPLY — 50 items
BENZOIN TINCTURE PRP APPL 2/3 (GAUZE/BANDAGES/DRESSINGS) IMPLANT
BLADE CLIPPER SURG (BLADE) IMPLANT
BLADE HEX COATED 2.75 (ELECTRODE) IMPLANT
BLADE SURG 15 STRL LF DISP TIS (BLADE) ×2 IMPLANT
BLADE SURG 15 STRL SS (BLADE) ×1
CANISTER SUCT 1200ML W/VALVE (MISCELLANEOUS) ×3 IMPLANT
CHLORAPREP W/TINT 26 (MISCELLANEOUS) ×3 IMPLANT
COVER BACK TABLE 60X90IN (DRAPES) ×3 IMPLANT
COVER MAYO STAND STRL (DRAPES) ×3 IMPLANT
COVER WAND RF STERILE (DRAPES) IMPLANT
DECANTER SPIKE VIAL GLASS SM (MISCELLANEOUS) ×3 IMPLANT
DERMABOND ADVANCED (GAUZE/BANDAGES/DRESSINGS) ×1
DERMABOND ADVANCED .7 DNX12 (GAUZE/BANDAGES/DRESSINGS) ×2 IMPLANT
DRAPE LAPAROTOMY 100X72 PEDS (DRAPES) ×3 IMPLANT
DRAPE UTILITY XL STRL (DRAPES) ×3 IMPLANT
DRSG TEGADERM 4X4.75 (GAUZE/BANDAGES/DRESSINGS) IMPLANT
ELECT COATED BLADE 2.86 ST (ELECTRODE) IMPLANT
ELECT REM PT RETURN 9FT ADLT (ELECTROSURGICAL) ×3
ELECTRODE REM PT RTRN 9FT ADLT (ELECTROSURGICAL) ×2 IMPLANT
GAUZE PACKING IODOFORM 1/4X15 (GAUZE/BANDAGES/DRESSINGS) IMPLANT
GAUZE SPONGE 4X4 12PLY STRL (GAUZE/BANDAGES/DRESSINGS) IMPLANT
GAUZE SPONGE 4X4 12PLY STRL LF (GAUZE/BANDAGES/DRESSINGS) IMPLANT
GLOVE BIO SURGEON STRL SZ7.5 (GLOVE) ×3 IMPLANT
GLOVE BIOGEL PI IND STRL 8 (GLOVE) ×2 IMPLANT
GLOVE BIOGEL PI INDICATOR 8 (GLOVE) ×1
GOWN STRL REUS W/ TWL LRG LVL3 (GOWN DISPOSABLE) ×2 IMPLANT
GOWN STRL REUS W/ TWL XL LVL3 (GOWN DISPOSABLE) ×2 IMPLANT
GOWN STRL REUS W/TWL LRG LVL3 (GOWN DISPOSABLE) ×1
GOWN STRL REUS W/TWL XL LVL3 (GOWN DISPOSABLE) ×1
NEEDLE HYPO 25X1 1.5 SAFETY (NEEDLE) ×3 IMPLANT
NS IRRIG 1000ML POUR BTL (IV SOLUTION) ×3 IMPLANT
PACK BASIN DAY SURGERY FS (CUSTOM PROCEDURE TRAY) ×3 IMPLANT
PENCIL SMOKE EVACUATOR (MISCELLANEOUS) ×3 IMPLANT
SLEEVE SCD COMPRESS KNEE MED (MISCELLANEOUS) IMPLANT
STRIP CLOSURE SKIN 1/2X4 (GAUZE/BANDAGES/DRESSINGS) IMPLANT
SUT ETHILON 2 0 FS 18 (SUTURE) IMPLANT
SUT ETHILON 4 0 PS 2 18 (SUTURE) IMPLANT
SUT MNCRL AB 4-0 PS2 18 (SUTURE) ×3 IMPLANT
SUT SILK 2 0 SH (SUTURE) IMPLANT
SUT VIC AB 2-0 SH 27 (SUTURE)
SUT VIC AB 2-0 SH 27XBRD (SUTURE) IMPLANT
SUT VICRYL 3-0 CR8 SH (SUTURE) ×3 IMPLANT
SUT VICRYL 4-0 PS2 18IN ABS (SUTURE) IMPLANT
SWAB COLLECTION DEVICE MRSA (MISCELLANEOUS) IMPLANT
SWAB CULTURE ESWAB REG 1ML (MISCELLANEOUS) IMPLANT
SYR CONTROL 10ML LL (SYRINGE) ×3 IMPLANT
TOWEL GREEN STERILE FF (TOWEL DISPOSABLE) ×3 IMPLANT
TUBE CONNECTING 20X1/4 (TUBING) ×3 IMPLANT
UNDERPAD 30X36 HEAVY ABSORB (UNDERPADS AND DIAPERS) ×3 IMPLANT
YANKAUER SUCT BULB TIP NO VENT (SUCTIONS) ×3 IMPLANT

## 2019-12-17 NOTE — Op Note (Signed)
12/17/2019  3:39 PM  PATIENT:  Alex Nguyen  74 y.o. male  PRE-OPERATIVE DIAGNOSIS:  Left BUTTOCK SEBACEOUS/epidermal inclusion CYST  POST-OPERATIVE DIAGNOSIS:  same  PROCEDURE:  Procedure(s): EXCISION OF LEFT BUTTOCK EPIDERMAL INCLUSION/SEBACEOUS CYST (2cm x 1.5cm)  SURGEON:  Surgeon(s): Greer Pickerel, MD  ASSISTANTS: none   ANESTHESIA:   local  DRAINS: none   LOCAL MEDICATIONS USED:  MARCAINE   , Amount: 8 ml and OTHER mixed with bicarb  SPECIMEN:  Source of Specimen:  Left buttock epidermal inclusion cyst  DISPOSITION OF SPECIMEN:  Discarded  COUNTS:  YES  INDICATION FOR PROCEDURE: Patient is a 74 year old man who developed a left buttock boil abscess in mid December.  He was started on antibiotics.  The infection resolved but the actual cyst did not actually go away.  He was concerned about recurrent or ongoing infection and desired surgical excision.  Please see chart for additional details  PROCEDURE: After obtaining consent patient was taken to the OR 3 at [facility] in place in the prone position.  His buttocks were exposed.  The cyst was marked in the holding area with the patient confirming the operative site with my initials.  The area was prepped and draped in the usual standard surgical fashion with ChloraPrep.  I drew an elliptical incision around the cyst in the lower left buttock area.  I then infiltrated a mix of quarter percent Marcaine with epinephrine mixed with sodium bicarbonate in the area.  Then using a 15 blade I made an elliptical incision through the skin and deep dermis excising the cyst in its entirety.  It was not that deep.  Length of incision and width was 2 cm x 1.5 centimeters.  I then used a rongeur to make sure there was no remaining cyst wall.  Hemostasis is achieved.  I then reapproximated the deep dermis with 2 inverted interrupted 3-0 Vicryl sutures.  Skin was closed with a running 4-0 Monocryl in a subcuticular fashion followed  by the application of Dermabond.  There were no immediate complications.  I discussed procedure with the patient.  He was taken to the preop area in stable condition.  No immediate complications.  Tolerated the procedure well  PLAN OF CARE: discharge  PATIENT DISPOSITION:  Short Stay   Delay start of Pharmacological VTE agent (>24hrs) due to surgical blood loss or risk of bleeding:  not applicable  Leighton Ruff. Redmond Pulling, MD, FACS General, Bariatric, & Minimally Invasive Surgery [facility], [place]

## 2019-12-17 NOTE — Discharge Instructions (Signed)
[facility] °LAPAROSCOPIC SURGERY: POST OP INSTRUCTIONS °Always review your discharge instruction sheet given to you by the facility where your surgery was performed. °IF YOU HAVE DISABILITY OR FAMILY LEAVE FORMS, YOU MUST BRING THEM TO THE OFFICE FOR PROCESSING.   °DO NOT GIVE THEM TO YOUR DOCTOR. ° °PAIN CONTROL ° °1. First take acetaminophen (Tylenol) AND/or ibuprofen (Advil) to control your pain after surgery.  Follow directions on package.  Taking acetaminophen (Tylenol) and/or ibuprofen (Advil) regularly after surgery will help to control your pain and lower the amount of prescription pain medication you may need.  You should not take more than 3,000 mg (3 grams) of acetaminophen (Tylenol) in 24 hours.  You should not take ibuprofen (Advil), aleve, motrin, naprosyn or other NSAIDS if you have a history of stomach ulcers or chronic kidney disease.  °2. A prescription for pain medication may be given to you upon discharge.  Take your pain medication as prescribed, if you still have uncontrolled pain after taking acetaminophen (Tylenol) or ibuprofen (Advil). °3. Use ice packs to help control pain. °4. If you need a refill on your pain medication, please contact your pharmacy.  They will contact our office to request authorization. Prescriptions will not be filled after 5pm or on week-ends. ° °HOME MEDICATIONS °5. Take your usually prescribed medications unless otherwise directed. ° °DIET °6. You should follow a light diet the first few days after arrival home.  Be sure to include lots of fluids daily. Avoid fatty, fried foods.  ° °CONSTIPATION °7. It is common to experience some constipation after surgery and if you are taking pain medication.  Increasing fluid intake and taking a stool softener (such as Colace) will usually help or prevent this problem from occurring.  A mild laxative (Milk of Magnesia or Miralax) should be taken according to package instructions if there are no bowel  movements after 48 hours. ° °WOUND/INCISION CARE °8. Most patients will experience some swelling and bruising in the area of the incisions.  Ice packs will help.  Swelling and bruising can take several days to resolve.  °9. Unless discharge instructions indicate otherwise, follow guidelines below  °a. STERI-STRIPS - you may remove your outer bandages 48 hours after surgery, and you may shower at that time.  You have steri-strips (small skin tapes) in place directly over the incision.  These strips should be left on the skin for 7-10 days.   °b. DERMABOND/SKIN GLUE - you may shower in 24 hours.  The glue will flake off over the next 2-3 weeks. °10. Any sutures or staples will be removed at the office during your follow-up visit. ° °ACTIVITIES °11. You may resume regular (light) daily activities beginning the next day--such as daily self-care, walking, climbing stairs--gradually increasing activities as tolerated.  You may have sexual intercourse when it is comfortable.  Refrain from any heavy lifting or straining until approved by your doctor. °a. You may drive when you are no longer taking prescription pain medication, you can comfortably wear a seatbelt, and you can safely maneuver your car and apply brakes. ° °FOLLOW-UP °12. You should see your doctor in the office for a follow-up appointment approximately 2-3 weeks after your surgery.  You should have been given your post-op/follow-up appointment when your surgery was scheduled.  If you did not receive a post-op/follow-up appointment, make sure that you call for this appointment within a day or two after you arrive home to insure a convenient appointment time. ° °OTHER   INSTRUCTIONS °13.  ° °WHEN TO CALL YOUR DOCTOR: °1. Fever over 101.0 °2. Inability to urinate °3. Continued bleeding from incision. °4. Increased pain, redness, or drainage from the incision. °5. Increasing abdominal pain ° °The clinic staff is available to answer your questions during regular  business hours.  Please don’t hesitate to call and ask to speak to one of the nurses for clinical concerns.  If you have a medical emergency, go to the nearest emergency room or call 911.  A surgeon from [facility] is always on call at the hospital. °[address], [address], [place], [place]  [address] ? [address] [address], [place], [place]   [address] °[phone] ? [phone] ? FAX [phone] °Web site: [email or web address] ° °

## 2019-12-17 NOTE — Interval H&P Note (Signed)
History and Physical Interval Note:  12/17/2019 2:53 PM  Alex Nguyen  has presented today for surgery, with the diagnosis of BUTTOCK SEBACEOUS CYST.  The various methods of treatment have been discussed with the patient and family. After consideration of risks, benefits and other options for treatment, the patient has consented to  Procedure(s): EXCISION OF BUTTOCK SEBACEOUS CYST (N/A) as a surgical intervention.  The patient's history has been reviewed, patient examined, no change in status, stable for surgery.  I have reviewed the patient's chart and labs.  Questions were answered to the patient's satisfaction.    Leighton Ruff. Redmond Pulling, MD, FACS General, Bariatric, & Minimally Invasive Surgery [facility]  Greer Pickerel

## 2019-12-18 ENCOUNTER — Encounter: Payer: Self-pay | Admitting: *Deleted

## 2020-02-07 DIAGNOSIS — E7800 Pure hypercholesterolemia, unspecified: Secondary | ICD-10-CM | POA: Diagnosis not present

## 2020-02-07 DIAGNOSIS — Z85820 Personal history of malignant melanoma of skin: Secondary | ICD-10-CM | POA: Diagnosis not present

## 2020-02-07 DIAGNOSIS — R202 Paresthesia of skin: Secondary | ICD-10-CM | POA: Diagnosis not present

## 2020-02-07 DIAGNOSIS — Z0000 Encounter for general adult medical examination without abnormal findings: Secondary | ICD-10-CM | POA: Diagnosis not present

## 2020-02-07 DIAGNOSIS — Z1389 Encounter for screening for other disorder: Secondary | ICD-10-CM | POA: Diagnosis not present

## 2020-02-07 DIAGNOSIS — Z1159 Encounter for screening for other viral diseases: Secondary | ICD-10-CM | POA: Diagnosis not present

## 2020-02-13 DIAGNOSIS — D485 Neoplasm of uncertain behavior of skin: Secondary | ICD-10-CM | POA: Diagnosis not present

## 2020-02-13 DIAGNOSIS — C44719 Basal cell carcinoma of skin of left lower limb, including hip: Secondary | ICD-10-CM | POA: Diagnosis not present

## 2020-02-13 DIAGNOSIS — D1801 Hemangioma of skin and subcutaneous tissue: Secondary | ICD-10-CM | POA: Diagnosis not present

## 2020-02-13 DIAGNOSIS — L821 Other seborrheic keratosis: Secondary | ICD-10-CM | POA: Diagnosis not present

## 2020-02-13 DIAGNOSIS — D225 Melanocytic nevi of trunk: Secondary | ICD-10-CM | POA: Diagnosis not present

## 2020-02-28 DIAGNOSIS — C44719 Basal cell carcinoma of skin of left lower limb, including hip: Secondary | ICD-10-CM | POA: Diagnosis not present

## 2020-02-28 DIAGNOSIS — C44629 Squamous cell carcinoma of skin of left upper limb, including shoulder: Secondary | ICD-10-CM | POA: Diagnosis not present

## 2020-02-28 DIAGNOSIS — L821 Other seborrheic keratosis: Secondary | ICD-10-CM | POA: Diagnosis not present

## 2020-02-28 DIAGNOSIS — D485 Neoplasm of uncertain behavior of skin: Secondary | ICD-10-CM | POA: Diagnosis not present

## 2020-03-12 DIAGNOSIS — H903 Sensorineural hearing loss, bilateral: Secondary | ICD-10-CM | POA: Diagnosis not present

## 2020-03-13 DIAGNOSIS — C44719 Basal cell carcinoma of skin of left lower limb, including hip: Secondary | ICD-10-CM | POA: Diagnosis not present

## 2020-03-13 DIAGNOSIS — C44629 Squamous cell carcinoma of skin of left upper limb, including shoulder: Secondary | ICD-10-CM | POA: Diagnosis not present

## 2020-04-15 ENCOUNTER — Other Ambulatory Visit: Payer: Medicare HMO

## 2020-04-15 ENCOUNTER — Other Ambulatory Visit: Payer: Self-pay | Admitting: Critical Care Medicine

## 2020-04-15 DIAGNOSIS — Z20822 Contact with and (suspected) exposure to covid-19: Secondary | ICD-10-CM

## 2020-04-17 LAB — SARS-COV-2, NAA 2 DAY TAT

## 2020-04-17 LAB — NOVEL CORONAVIRUS, NAA: SARS-CoV-2, NAA: NOT DETECTED

## 2020-04-20 DIAGNOSIS — Z85828 Personal history of other malignant neoplasm of skin: Secondary | ICD-10-CM | POA: Diagnosis not present

## 2020-04-20 DIAGNOSIS — D2239 Melanocytic nevi of other parts of face: Secondary | ICD-10-CM | POA: Diagnosis not present

## 2020-04-20 DIAGNOSIS — D1801 Hemangioma of skin and subcutaneous tissue: Secondary | ICD-10-CM | POA: Diagnosis not present

## 2020-04-20 DIAGNOSIS — L905 Scar conditions and fibrosis of skin: Secondary | ICD-10-CM | POA: Diagnosis not present

## 2020-04-20 DIAGNOSIS — D485 Neoplasm of uncertain behavior of skin: Secondary | ICD-10-CM | POA: Diagnosis not present

## 2020-04-20 DIAGNOSIS — L820 Inflamed seborrheic keratosis: Secondary | ICD-10-CM | POA: Diagnosis not present

## 2020-08-27 ENCOUNTER — Other Ambulatory Visit: Payer: Medicare HMO

## 2020-08-27 DIAGNOSIS — Z20822 Contact with and (suspected) exposure to covid-19: Secondary | ICD-10-CM | POA: Diagnosis not present

## 2020-08-29 LAB — NOVEL CORONAVIRUS, NAA: SARS-CoV-2, NAA: NOT DETECTED

## 2020-08-29 LAB — SARS-COV-2, NAA 2 DAY TAT

## 2020-09-21 DIAGNOSIS — R030 Elevated blood-pressure reading, without diagnosis of hypertension: Secondary | ICD-10-CM | POA: Diagnosis not present

## 2020-09-21 DIAGNOSIS — G4700 Insomnia, unspecified: Secondary | ICD-10-CM | POA: Diagnosis not present

## 2020-10-22 DIAGNOSIS — K5900 Constipation, unspecified: Secondary | ICD-10-CM | POA: Diagnosis not present

## 2020-10-22 DIAGNOSIS — Z800 Family history of malignant neoplasm of digestive organs: Secondary | ICD-10-CM | POA: Diagnosis not present

## 2020-10-22 DIAGNOSIS — Z1211 Encounter for screening for malignant neoplasm of colon: Secondary | ICD-10-CM | POA: Diagnosis not present

## 2020-10-22 DIAGNOSIS — R194 Change in bowel habit: Secondary | ICD-10-CM | POA: Diagnosis not present

## 2021-02-08 DIAGNOSIS — I10 Essential (primary) hypertension: Secondary | ICD-10-CM | POA: Diagnosis not present

## 2021-02-08 DIAGNOSIS — E7800 Pure hypercholesterolemia, unspecified: Secondary | ICD-10-CM | POA: Diagnosis not present

## 2021-02-08 DIAGNOSIS — Z85820 Personal history of malignant melanoma of skin: Secondary | ICD-10-CM | POA: Diagnosis not present

## 2021-02-08 DIAGNOSIS — Z0000 Encounter for general adult medical examination without abnormal findings: Secondary | ICD-10-CM | POA: Diagnosis not present

## 2021-02-08 DIAGNOSIS — Z1389 Encounter for screening for other disorder: Secondary | ICD-10-CM | POA: Diagnosis not present

## 2021-03-11 ENCOUNTER — Other Ambulatory Visit: Payer: Self-pay

## 2021-03-11 ENCOUNTER — Ambulatory Visit: Payer: Medicare HMO | Admitting: Sports Medicine

## 2021-03-11 ENCOUNTER — Encounter: Payer: Self-pay | Admitting: Sports Medicine

## 2021-03-11 VITALS — BP 138/80 | Ht 75.0 in | Wt 175.0 lb

## 2021-03-11 DIAGNOSIS — M1711 Unilateral primary osteoarthritis, right knee: Secondary | ICD-10-CM

## 2021-03-11 DIAGNOSIS — M2170 Unequal limb length (acquired), unspecified site: Secondary | ICD-10-CM

## 2021-03-11 NOTE — Progress Notes (Addendum)
   Alex Nguyen is a 75 y.o. male who presents to [facility] today for the following:  Right knee pain History of right knee meniscal surgery in the 1970s. Previously saw Dr. Oneida Alar 10 years ago and was given inserts with a heel wedge placed in medial position. Does note that his right knee will become more swollen after jogging and uses ice packs to help the swelling with some improvement. Wants to know if he should still be wearing these particular inserts. Also wondering if he needs stability versus neutral shoes.   PMH reviewed.  ROS as above. Medications reviewed.  Exam:  BP 138/80   Ht 6\' 3"  (1.905 m)   Wt 175 lb (79.4 kg)   BMI 21.87 kg/m  Gen: Well NAD MSK:  Right Knee: - Inspection: no gross deformity bilaterally. No swelling/effusion, erythema or bruising. Skin intact bilaterally - Palpation: no TTP bilaterally - ROM: Mild decreased ROM of the right knee with extension/flexion.  Full active ROM with flexion and extension of the left knee and hip. Crepitus noted with passive and active ROM in bilateral knees. - Strength: 5/5 strength bilaterally - Neuro/vasc: NV intact bilaterally - Special Tests: - LIGAMENTS: negative anterior and posterior drawer, negative Lachman's, no MCL or LCL laxity  -- MENISCUS: negative McMurray's, negative Thessaly bilaterally -- PF JOINT: nml patellar mobility bilaterally.  negative patellar grind, negative patellar apprehension  Hips: normal ROM, negative FABER and FADIR bilaterally  Running gait analysis: Patient has genu valgum as well as supination of the feet bilaterally. Noted to have some flexion contracture causing a small leg length difference.   No results found.   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 Running gait analysis performed with notable genu valgum as well as supination of the feet  bilaterally, though more pronounced on the right foot.  Previously had in medial wedges to help correct the knee position, but at this time we will trial out neutral inserts with a neutral heel wedge on the right foot to accommodate for mild contracture of right knee.  Rise Patience, DO   Patient seen and evaluated with the resident.  I agree with the above plan of care.  In 2011 the patient was fitted with green inserts with medial wedges but gait analysis today shows supination bilaterally when running, right greater than left.  I would like to exchange his current inserts for neutral inserts with a simple neutral heel lift on the right to accommodate his leg length discrepancy secondary to his right knee flexion contracture from his arthritis.  I recommended that he continue to run in well cushioned neutral shoes.  He will purchase a new pair of running shoes soon.  He will return to the office in 4 weeks for reevaluation.  In the meantime, he will work on quad and hip abductor strengthening as well.    Future considerations (if symptoms worsen with today's corrections) could include resuming his previous inserts with the medial heel wedge since he has done well with them for over a decade or possibly going in a completely different direction and trying to correct his supination by using a lateral heel wedge.

## 2021-03-11 NOTE — Patient Instructions (Signed)
For the next 4 weeks we are going to try a different strategy for your knee pain. We will use a neutral green insert and a neutral heel lift for your right foot. Make sure to get new shoes, we recommend a cushioned neutral shoe. Complete the exercises to increase your quad and hip abductor strength. Follow-up in 4 weeks to report how your runs are going with the new inserts.

## 2021-03-25 DIAGNOSIS — H938X3 Other specified disorders of ear, bilateral: Secondary | ICD-10-CM | POA: Diagnosis not present

## 2021-03-25 DIAGNOSIS — Z8669 Personal history of other diseases of the nervous system and sense organs: Secondary | ICD-10-CM | POA: Diagnosis not present

## 2021-04-13 ENCOUNTER — Ambulatory Visit: Payer: Medicare HMO | Admitting: Sports Medicine

## 2021-04-13 ENCOUNTER — Other Ambulatory Visit: Payer: Self-pay

## 2021-04-13 VITALS — BP 122/74 | Ht 74.5 in | Wt 170.0 lb

## 2021-04-13 DIAGNOSIS — M2170 Unequal limb length (acquired), unspecified site: Secondary | ICD-10-CM | POA: Diagnosis not present

## 2021-04-13 DIAGNOSIS — M1711 Unilateral primary osteoarthritis, right knee: Secondary | ICD-10-CM

## 2021-04-13 NOTE — Progress Notes (Signed)
PCP: Lavone Orn, MD  Subjective:   HPI: Patient is a 75 y.o. male here for follow-up of right knee pain.   At last visit,  "Previously had in medial wedges to help correct the knee position, but at this time we will trial out neutral inserts with a neutral heel wedge on the right foot to accommodate for mild contracture of right knee."  Today, patient's pain is much improved.  He feels more comfortable while running in the neutral shoe insoles with his right 3/16 inch heel lift.  His gait feels more natural and he has less pain with running and following the ride.  He has been able to run 3 miles daily at a faster speed and with less pain.  He is happy with the change and would like to know the dimensions of his heel left so that he can order more in the future.  No new injuries or other concerns.  Past Medical History:  Diagnosis Date   Hyperlipidemia    Hypertension    Left ureteral calculus     Review of Systems: See HPI above.     Objective:  Physical Exam:  Gen: NAD, comfortable in exam room  Right knee: No gross deformity bilaterally; no erythema ecchymosis or swelling noted.  No tenderness to palpation over medial or lateral joint line.  The patient can only extend to about 5-10 degrees, unable to get to 0.  Flexion is full at about 135 degrees without pain.  There is crepitus with full knee extension.  No instability to varus or valgus stress at 0 and 30 degrees, albeit crepitus with valgus stress over the lateral compartment.  Strength 5/5 bilaterally.  Neurovascularly intact.  Sensation to light touch intact throughout dorsal and plantar aspect of the lower extremity.  Gait analysis: Moderate genu valgum of bilateral LE's as knees slightly cross midline with swing through phase. There is a degree of supination through foot strike b/l, but improved from last visit with insole change. Adequate heel strike through push-off phase.   Assessment & Plan:  1.  Right knee pain,  chronic in the setting of unequal leg length - improved.  Pain and running gait function improved with alteration of placing him back in a neutral shoe insole (opposed to medial heel wedge).  He still does have some slight supination and genu valgum, however improved with shoe modification. He is to continue HEP for knee and lateral abductors. The patient was provided with additional heel lifts, 3/16 inch to place in the right shoe only.  Follow-up as needed or if any issues arise sooner.  Alex Barman, DO PGY-4, Sports Medicine Fellow [place]   Patient seen and evaluated with the sports medicine fellow.  I agree with the above plan of care.  Patient has noticed significant improvement with right knee pain after making the adjustments to his inserts.  He now simply has a green sports insole with a heel lift on the right.  He understands the importance of continuing with his home exercises and wearing his compression sleeve with running.  Given his overall improvement, we will discharge him to follow-up as needed.

## 2021-04-13 NOTE — Patient Instructions (Signed)
It was great to meet you today, thank you for letting me participate in your care!  Today, we discussed: maintaining shoes and insoles with a neutral tilt. We will continue the heel lift in the right shoe. Be sure to continue with the hip exercises to work on preventing your knees from collapsing in with running (genu valgum).  Your heel lift dimensions:  - width: 2 and 1/2 inch - thickness: 3/16th of an inch  You will follow-up only as needed.  If you have any further questions, please give the clinic a call [phone].  [place],  Elba Barman, DO PGY-4, Sports Medicine Fellow [place]

## 2021-04-20 DIAGNOSIS — D485 Neoplasm of uncertain behavior of skin: Secondary | ICD-10-CM | POA: Diagnosis not present

## 2021-04-20 DIAGNOSIS — L821 Other seborrheic keratosis: Secondary | ICD-10-CM | POA: Diagnosis not present

## 2021-04-20 DIAGNOSIS — L905 Scar conditions and fibrosis of skin: Secondary | ICD-10-CM | POA: Diagnosis not present

## 2021-04-20 DIAGNOSIS — Z85828 Personal history of other malignant neoplasm of skin: Secondary | ICD-10-CM | POA: Diagnosis not present

## 2021-04-20 DIAGNOSIS — C44519 Basal cell carcinoma of skin of other part of trunk: Secondary | ICD-10-CM | POA: Diagnosis not present

## 2021-05-20 DIAGNOSIS — R030 Elevated blood-pressure reading, without diagnosis of hypertension: Secondary | ICD-10-CM | POA: Diagnosis not present

## 2021-05-28 DIAGNOSIS — D225 Melanocytic nevi of trunk: Secondary | ICD-10-CM | POA: Diagnosis not present

## 2021-05-28 DIAGNOSIS — L905 Scar conditions and fibrosis of skin: Secondary | ICD-10-CM | POA: Diagnosis not present

## 2021-05-28 DIAGNOSIS — C44519 Basal cell carcinoma of skin of other part of trunk: Secondary | ICD-10-CM | POA: Diagnosis not present

## 2021-05-28 DIAGNOSIS — D485 Neoplasm of uncertain behavior of skin: Secondary | ICD-10-CM | POA: Diagnosis not present

## 2021-08-25 DIAGNOSIS — Z20822 Contact with and (suspected) exposure to covid-19: Secondary | ICD-10-CM | POA: Diagnosis not present

## 2021-09-10 DIAGNOSIS — L298 Other pruritus: Secondary | ICD-10-CM | POA: Diagnosis not present

## 2021-09-10 DIAGNOSIS — C44629 Squamous cell carcinoma of skin of left upper limb, including shoulder: Secondary | ICD-10-CM | POA: Diagnosis not present

## 2021-09-10 DIAGNOSIS — L821 Other seborrheic keratosis: Secondary | ICD-10-CM | POA: Diagnosis not present

## 2021-09-10 DIAGNOSIS — L570 Actinic keratosis: Secondary | ICD-10-CM | POA: Diagnosis not present

## 2021-09-10 DIAGNOSIS — Z85828 Personal history of other malignant neoplasm of skin: Secondary | ICD-10-CM | POA: Diagnosis not present

## 2021-09-10 DIAGNOSIS — D1801 Hemangioma of skin and subcutaneous tissue: Secondary | ICD-10-CM | POA: Diagnosis not present

## 2021-09-10 DIAGNOSIS — Z789 Other specified health status: Secondary | ICD-10-CM | POA: Diagnosis not present

## 2021-09-10 DIAGNOSIS — Z08 Encounter for follow-up examination after completed treatment for malignant neoplasm: Secondary | ICD-10-CM | POA: Diagnosis not present

## 2021-09-10 DIAGNOSIS — R229 Localized swelling, mass and lump, unspecified: Secondary | ICD-10-CM | POA: Diagnosis not present

## 2021-09-10 DIAGNOSIS — L538 Other specified erythematous conditions: Secondary | ICD-10-CM | POA: Diagnosis not present

## 2021-09-29 DIAGNOSIS — R3911 Hesitancy of micturition: Secondary | ICD-10-CM | POA: Diagnosis not present

## 2021-09-29 DIAGNOSIS — R030 Elevated blood-pressure reading, without diagnosis of hypertension: Secondary | ICD-10-CM | POA: Diagnosis not present

## 2021-09-29 DIAGNOSIS — N401 Enlarged prostate with lower urinary tract symptoms: Secondary | ICD-10-CM | POA: Diagnosis not present

## 2021-10-07 DIAGNOSIS — L570 Actinic keratosis: Secondary | ICD-10-CM | POA: Diagnosis not present

## 2021-10-07 DIAGNOSIS — L439 Lichen planus, unspecified: Secondary | ICD-10-CM | POA: Diagnosis not present

## 2021-10-07 DIAGNOSIS — D0462 Carcinoma in situ of skin of left upper limb, including shoulder: Secondary | ICD-10-CM | POA: Diagnosis not present

## 2021-10-07 DIAGNOSIS — D485 Neoplasm of uncertain behavior of skin: Secondary | ICD-10-CM | POA: Diagnosis not present

## 2021-10-07 DIAGNOSIS — L578 Other skin changes due to chronic exposure to nonionizing radiation: Secondary | ICD-10-CM | POA: Diagnosis not present

## 2021-10-14 DIAGNOSIS — L821 Other seborrheic keratosis: Secondary | ICD-10-CM | POA: Diagnosis not present

## 2021-10-14 DIAGNOSIS — L570 Actinic keratosis: Secondary | ICD-10-CM | POA: Diagnosis not present

## 2021-11-05 DIAGNOSIS — L578 Other skin changes due to chronic exposure to nonionizing radiation: Secondary | ICD-10-CM | POA: Diagnosis not present

## 2021-11-05 DIAGNOSIS — L244 Irritant contact dermatitis due to drugs in contact with skin: Secondary | ICD-10-CM | POA: Diagnosis not present

## 2022-02-10 DIAGNOSIS — Z85820 Personal history of malignant melanoma of skin: Secondary | ICD-10-CM | POA: Diagnosis not present

## 2022-02-10 DIAGNOSIS — Z1331 Encounter for screening for depression: Secondary | ICD-10-CM | POA: Diagnosis not present

## 2022-02-10 DIAGNOSIS — G4700 Insomnia, unspecified: Secondary | ICD-10-CM | POA: Diagnosis not present

## 2022-02-10 DIAGNOSIS — E7800 Pure hypercholesterolemia, unspecified: Secondary | ICD-10-CM | POA: Diagnosis not present

## 2022-02-10 DIAGNOSIS — N401 Enlarged prostate with lower urinary tract symptoms: Secondary | ICD-10-CM | POA: Diagnosis not present

## 2022-02-10 DIAGNOSIS — Z0000 Encounter for general adult medical examination without abnormal findings: Secondary | ICD-10-CM | POA: Diagnosis not present

## 2022-03-10 DIAGNOSIS — Z09 Encounter for follow-up examination after completed treatment for conditions other than malignant neoplasm: Secondary | ICD-10-CM | POA: Diagnosis not present

## 2022-03-10 DIAGNOSIS — R229 Localized swelling, mass and lump, unspecified: Secondary | ICD-10-CM | POA: Diagnosis not present

## 2022-03-10 DIAGNOSIS — Z85828 Personal history of other malignant neoplasm of skin: Secondary | ICD-10-CM | POA: Diagnosis not present

## 2022-03-10 DIAGNOSIS — Z872 Personal history of diseases of the skin and subcutaneous tissue: Secondary | ICD-10-CM | POA: Diagnosis not present

## 2022-03-10 DIAGNOSIS — L821 Other seborrheic keratosis: Secondary | ICD-10-CM | POA: Diagnosis not present

## 2022-03-10 DIAGNOSIS — C44629 Squamous cell carcinoma of skin of left upper limb, including shoulder: Secondary | ICD-10-CM | POA: Diagnosis not present

## 2022-03-10 DIAGNOSIS — D485 Neoplasm of uncertain behavior of skin: Secondary | ICD-10-CM | POA: Diagnosis not present

## 2022-03-10 DIAGNOSIS — Z85820 Personal history of malignant melanoma of skin: Secondary | ICD-10-CM | POA: Diagnosis not present

## 2022-03-10 DIAGNOSIS — Z08 Encounter for follow-up examination after completed treatment for malignant neoplasm: Secondary | ICD-10-CM | POA: Diagnosis not present

## 2022-03-11 ENCOUNTER — Encounter (HOSPITAL_COMMUNITY): Payer: Self-pay | Admitting: Emergency Medicine

## 2022-03-11 ENCOUNTER — Ambulatory Visit (HOSPITAL_COMMUNITY)
Admission: EM | Admit: 2022-03-11 | Discharge: 2022-03-11 | Disposition: A | Discharge status: Home / Self Care | Payer: Medicare HMO

## 2022-03-11 DIAGNOSIS — Z9889 Other specified postprocedural states: Secondary | ICD-10-CM

## 2022-03-11 DIAGNOSIS — R58 Hemorrhage, not elsewhere classified: Secondary | ICD-10-CM

## 2022-03-11 NOTE — ED Triage Notes (Signed)
Had tooth extracted around noon today. Took gauze out after hour and half and had bleeding so changed out. Reports guaze currently been in for 2 hours and scared to take out for bleeding to continue. Left message for on call but hasnt received call back. Pt not on blood thinners.

## 2022-03-11 NOTE — Discharge Instructions (Signed)
Apply gauze and direct pressure as needed for bleeding. It may continue to trickle for the rest of the afternoon.  Try to call the on-call dentist again if you do not hear from them.  Any signs of worsening or heavy bleed, please go to the emergency department.

## 2022-03-11 NOTE — ED Provider Notes (Signed)
[place]    CSN: [id number] Arrival date & time: 03/11/22  1504     History   Chief Complaint Chief Complaint  Patient presents with   Dental Problem    HPI Alex Nguyen is a 76 y.o. male.  Presents about 4 hours after having a tooth extracted.  Was told by the dentist to remove the gauze after 2 hours.  He removed the gauze and noted it was still bleeding.  Applied a new gauze patch and reported to the urgent care.  He did try to call the on-call dentist but they never returned his call. He was concerned about the continued bleeding as his dentist did not tell him what to expect. He does not take any blood thinners or aspirin.  No history of clotting or bleeding disorder. Minimal pain in the area.  Past Medical History:  Diagnosis Date   Hyperlipidemia    Hypertension    Left ureteral calculus     Patient Active Problem List   Diagnosis Date Noted   KNEE PAIN, RIGHT, CHRONIC 06/28/2010   UNEQUAL LEG LENGTH 06/28/2010   ABNORMALITY OF GAIT 06/28/2010    Past Surgical History:  Procedure Laterality Date   CATARACT EXTRACTION W/ INTRAOCULAR LENS  IMPLANT, BILATERAL     CYSTOSCOPY WITH RETROGRADE PYELOGRAM, URETEROSCOPY AND STENT PLACEMENT Left 08/07/2013   Procedure: CYSTOSCOPY WITH RETROGRADE PYELOGRAM, URETEROSCOPY AND  STENT PLACEMENT;  Surgeon: Molli Hazard, MD;  Location: [facility];  Service: Urology;  Laterality: Left;   HOLMIUM LASER APPLICATION Left 15/40/0867   Procedure: HOLMIUM LASER APPLICATION;  Surgeon: Molli Hazard, MD;  Location: [facility];  Service: Urology;  Laterality: Left;   KNEE ARTHROSCOPY W/ MENISCAL REPAIR Right 1960'S   LESION REMOVAL Left 12/17/2019   Procedure: EXCISION OF BUTTOCK SEBACEOUS CYST;  Surgeon: Greer Pickerel, MD;  Location: [facility];  Service: General;  Laterality: Left;   RETINAL DETACHMENT SURGERY Right 2011       Home Medications    Prior to  Admission medications   Medication Sig Start Date End Date Taking? Authorizing Provider  atorvastatin (LIPITOR) 10 MG tablet Take 10 mg by mouth daily.    [provider]  Multiple Vitamin (DAILY MULTIVITAMIN PO) Take 1 tablet by mouth. Once daily     [provider]    Family History No family history on file.  Social History Social History   Tobacco Use   Smoking status: Never   Smokeless tobacco: Never  Substance Use Topics   Alcohol use: Yes    Comment: RARE   Drug use: No     Allergies   Patient has no known allergies.   Review of Systems Review of Systems  Per HPI  Physical Exam Triage Vital Signs ED Triage Vitals  Enc Vitals Group     BP 03/11/22 1604 (!) 157/81     Pulse Rate 03/11/22 1604 62     Resp 03/11/22 1604 17     Temp 03/11/22 1604 98.1 F (36.7 C)     Temp Source 03/11/22 1604 Oral     SpO2 03/11/22 1604 98 %     Weight --      Height --      Head Circumference --      Peak Flow --      Pain Score 03/11/22 1603 0     Pain Loc --      Pain Edu? --  Excl. in GC? --    No data found.  Updated Vital Signs BP (!) 157/81 (BP Location: Left Arm)   Pulse 62   Temp 98.1 F (36.7 C) (Oral)   Resp 17   SpO2 98%    Physical Exam Vitals and nursing note reviewed.  Constitutional:      Appearance: Normal appearance.  HENT:     Mouth/Throat:     Mouth: Mucous membranes are moist.     Dentition: Dental caries present.     Pharynx: Oropharynx is clear.      Comments: Upper left molar absent, I note 2 stitches in place.  Very minor trickle of bleeding from the area.  Signs of clotting in the area. Eyes:     Conjunctiva/sclera: Conjunctivae normal.  Cardiovascular:     Rate and Rhythm: Normal rate and regular rhythm.     Pulses: Normal pulses.     Heart sounds: Normal heart sounds.  Pulmonary:     Effort: Pulmonary effort is normal.     Breath sounds: Normal breath sounds.  Neurological:     Mental Status: He is  alert and oriented to person, place, and time.     UC Treatments / Results  Labs (all labs ordered are listed, but only abnormal results are displayed) Labs Reviewed - No data to display  EKG   Radiology No results found.  Procedures Procedures   Medications Ordered in UC Medications - No data to display  Initial Impression / Assessment and Plan / UC Course  I have reviewed the triage vital signs and the nursing notes.  Pertinent labs & imaging results that were available during my care of the patient were reviewed by me and considered in my medical decision making (see chart for details).  Bleeding controlled on exam.  I recommend he put a new piece of gauze in place and apply direct pressure if bleeding restarts.  Any severe or heavy bleeding he should go to the emergency department.  He will try to contact the on-call dentist again regarding next steps.  Otherwise I am not concerned at this time, discussed with patient to expect some light bleeding possibly for the rest of the night as he had basically a trauma to the mouth with this procedure. Return precautions discussed. Patient agrees to plan and is discharged in stable condition.  Final Clinical Impressions(s) / UC Diagnoses   Final diagnoses:  Bleeding  History of recent dental procedure     Discharge Instructions      Apply gauze and direct pressure as needed for bleeding. It may continue to trickle for the rest of the afternoon.  Try to call the on-call dentist again if you do not hear from them.  Any signs of worsening or heavy bleed, please go to the emergency department.    ED Prescriptions   None    PDMP not reviewed this encounter.   [place], [place] 03/11/22 1653

## 2022-04-07 ENCOUNTER — Ambulatory Visit (HOSPITAL_COMMUNITY)
Admission: EM | Admit: 2022-04-07 | Discharge: 2022-04-07 | Disposition: A | Discharge status: Home / Self Care | Payer: Medicare HMO

## 2022-04-07 ENCOUNTER — Encounter (HOSPITAL_COMMUNITY): Payer: Self-pay | Admitting: Emergency Medicine

## 2022-04-07 DIAGNOSIS — S01511A Laceration without foreign body of lip, initial encounter: Secondary | ICD-10-CM

## 2022-04-07 NOTE — Discharge Instructions (Addendum)
You may take Tylenol over-the-counter as needed to relieve pain after the numbing wears off to your lip laceration. Your mouth is an area that receives a lot of blood flow so will heal very quickly. If you notice worsening bleeding, drainage from the site, swelling, or worsening pain, please return to urgent care for evaluation.  Eat soft foods until your numbing wears off so that you do not bite your lip.   Return to urgent care as needed for reevaluation.

## 2022-04-07 NOTE — ED Triage Notes (Signed)
Pt reports biting lower lip today. States he was seen at the dentist and was given a medication to numb his mouth and bit his lower lip while trying to eat. Bleeding controlled.

## 2022-04-07 NOTE — ED Provider Notes (Signed)
[place]    CSN: [id number] Arrival date & time: 04/07/22  1506      History   Chief Complaint Chief Complaint  Patient presents with   Lip Laceration    HPI Alex Nguyen is a 76 y.o. male.   Patient presents to urgent care for evaluation of lip injury that happened today.  Patient was numbed for dental work this afternoon to the left upper and lower aspect of his mouth.  Patient went home, ate a sandwich while he was still numb, and bit his lower lip in the process.  Lower lip is not bleeding at this time.  Patient states that he only ate half of a sandwich and had to put the rest of it back in the refrigerator after he bit his lower lip.  Patient thought that the numbing had mostly worn off but quickly realized that he was still numb.  Denies biting his tongue.  He is still slightly numb to the lower lip at this time.    Past Medical History:  Diagnosis Date   Hyperlipidemia    Hypertension    Left ureteral calculus     Patient Active Problem List   Diagnosis Date Noted   KNEE PAIN, RIGHT, CHRONIC 06/28/2010   UNEQUAL LEG LENGTH 06/28/2010   ABNORMALITY OF GAIT 06/28/2010    Past Surgical History:  Procedure Laterality Date   CATARACT EXTRACTION W/ INTRAOCULAR LENS  IMPLANT, BILATERAL     CYSTOSCOPY WITH RETROGRADE PYELOGRAM, URETEROSCOPY AND STENT PLACEMENT Left 08/07/2013   Procedure: CYSTOSCOPY WITH RETROGRADE PYELOGRAM, URETEROSCOPY AND  STENT PLACEMENT;  Surgeon: Molli Hazard, MD;  Location: [facility];  Service: Urology;  Laterality: Left;   HOLMIUM LASER APPLICATION Left 09/38/1829   Procedure: HOLMIUM LASER APPLICATION;  Surgeon: Molli Hazard, MD;  Location: [facility];  Service: Urology;  Laterality: Left;   KNEE ARTHROSCOPY W/ MENISCAL REPAIR Right 1960'S   LESION REMOVAL Left 12/17/2019   Procedure: EXCISION OF BUTTOCK SEBACEOUS CYST;  Surgeon: Greer Pickerel, MD;  Location: [facility];  Service: General;  Laterality: Left;   RETINAL DETACHMENT SURGERY Right 2011       Home Medications    Prior to Admission medications   Medication Sig Start Date End Date Taking? Authorizing Provider  atorvastatin (LIPITOR) 10 MG tablet Take 10 mg by mouth daily.    [provider]  Multiple Vitamin (DAILY MULTIVITAMIN PO) Take 1 tablet by mouth. Once daily     [provider]    Family History History reviewed. No pertinent family history.  Social History Social History   Tobacco Use   Smoking status: Never   Smokeless tobacco: Never  Substance Use Topics   Alcohol use: Yes    Comment: RARE   Drug use: No     Allergies   Patient has no known allergies.   Review of Systems Review of Systems Per HPI  Physical Exam Triage Vital Signs ED Triage Vitals [04/07/22 1556]  Enc Vitals Group     BP (!) 162/83     Pulse Rate 62     Resp 16     Temp 98 F (36.7 C)     Temp Source Oral     SpO2 96 %     Weight      Height      Head Circumference      Peak Flow      Pain Score 0  Pain Loc      Pain Edu?      Excl. in [place]?    No data found.  Updated Vital Signs BP (!) 162/83 (BP Location: Right Arm)   Pulse 62   Temp 98 F (36.7 C) (Oral)   Resp 16   SpO2 96%   Visual Acuity Right Eye Distance:   Left Eye Distance:   Bilateral Distance:    Right Eye Near:   Left Eye Near:    Bilateral Near:     Physical Exam Vitals and nursing note reviewed.  Constitutional:      Appearance: Normal appearance. He is not ill-appearing or toxic-appearing.     Comments: Very pleasant patient sitting on exam in position of comfort table in no acute distress.   HENT:     Head: Normocephalic and atraumatic.     Right Ear: Hearing and external ear normal.     Left Ear: Hearing and external ear normal.     Nose: Nose normal.     Mouth/Throat:     Lips: Pink.     Mouth: Mucous membranes are moist.     Pharynx: No posterior  oropharyngeal erythema.     Comments: Lip laceration to the inner aspect of the lower lip.  See image below for details. Laceration is not deep and is not currently bleeding. Edges are well approximated.  Eyes:     General: Lids are normal. Vision grossly intact. Gaze aligned appropriately.     Extraocular Movements: Extraocular movements intact.     Conjunctiva/sclera: Conjunctivae normal.  Pulmonary:     Effort: Pulmonary effort is normal.  Abdominal:     Palpations: Abdomen is soft.  Musculoskeletal:     Cervical back: Neck supple.  Skin:    General: Skin is warm and dry.     Capillary Refill: Capillary refill takes less than 2 seconds.     Findings: No rash.  Neurological:     General: No focal deficit present.     Mental Status: He is alert and oriented to person, place, and time. Mental status is at baseline.     Cranial Nerves: No dysarthria or facial asymmetry.     Gait: Gait is intact.  Psychiatric:        Mood and Affect: Mood normal.        Speech: Speech normal.        Behavior: Behavior normal.        Thought Content: Thought content normal.        Judgment: Judgment normal.      UC Treatments / Results  Labs (all labs ordered are listed, but only abnormal results are displayed) Labs Reviewed - No data to display  EKG   Radiology No results found.  Procedures Procedures (including critical care time)  Medications Ordered in UC Medications - No data to display  Initial Impression / Assessment and Plan / UC Course  I have reviewed the triage vital signs and the nursing notes.  Pertinent labs & imaging results that were available during my care of the patient were reviewed by me and considered in my medical decision making (see chart for details).   1. Lip laceration Lip laceration will likely heal quickly on its own.  No clinical indication for laceration repair at this time.  Patient may use over-the-counter Tylenol as needed for any pain that he may  have due to lip laceration after the numbing wears off.  Advised to eat soft foods  until the numbing wears off so that he does not bite his lip further and cause further injury.  Patient agreeable with this plan.  ER and urgent care return precautions given.  Patient verbalizes understanding and agreement with plan.  Discharged from urgent care in stable condition.  Final Clinical Impressions(s) / UC Diagnoses   Final diagnoses:  Lip laceration, initial encounter     Discharge Instructions      You may take Tylenol over-the-counter as needed to relieve pain after the numbing wears off to your lip laceration. Your mouth is an area that receives a lot of blood flow so will heal very quickly. If you notice worsening bleeding, drainage from the site, swelling, or worsening pain, please return to urgent care for evaluation.  Eat soft foods until your numbing wears off so that you do not bite your lip.   Return to urgent care as needed for reevaluation.   ED Prescriptions   None    PDMP not reviewed this encounter.   Talbot Grumbling, [place] 04/10/22 2139

## 2022-05-05 DIAGNOSIS — R21 Rash and other nonspecific skin eruption: Secondary | ICD-10-CM | POA: Diagnosis not present

## 2022-05-24 DIAGNOSIS — L2089 Other atopic dermatitis: Secondary | ICD-10-CM | POA: Diagnosis not present

## 2022-05-24 DIAGNOSIS — L738 Other specified follicular disorders: Secondary | ICD-10-CM | POA: Diagnosis not present

## 2022-06-09 DIAGNOSIS — L301 Dyshidrosis [pompholyx]: Secondary | ICD-10-CM | POA: Diagnosis not present

## 2022-06-09 DIAGNOSIS — L2089 Other atopic dermatitis: Secondary | ICD-10-CM | POA: Diagnosis not present

## 2022-07-08 DIAGNOSIS — D225 Melanocytic nevi of trunk: Secondary | ICD-10-CM | POA: Diagnosis not present

## 2022-07-08 DIAGNOSIS — R229 Localized swelling, mass and lump, unspecified: Secondary | ICD-10-CM | POA: Diagnosis not present

## 2022-07-08 DIAGNOSIS — Z08 Encounter for follow-up examination after completed treatment for malignant neoplasm: Secondary | ICD-10-CM | POA: Diagnosis not present

## 2022-07-08 DIAGNOSIS — L570 Actinic keratosis: Secondary | ICD-10-CM | POA: Diagnosis not present

## 2022-07-08 DIAGNOSIS — Z85828 Personal history of other malignant neoplasm of skin: Secondary | ICD-10-CM | POA: Diagnosis not present

## 2022-07-08 DIAGNOSIS — Z85820 Personal history of malignant melanoma of skin: Secondary | ICD-10-CM | POA: Diagnosis not present

## 2022-07-08 DIAGNOSIS — C44629 Squamous cell carcinoma of skin of left upper limb, including shoulder: Secondary | ICD-10-CM | POA: Diagnosis not present

## 2022-07-08 DIAGNOSIS — L821 Other seborrheic keratosis: Secondary | ICD-10-CM | POA: Diagnosis not present

## 2022-07-25 ENCOUNTER — Other Ambulatory Visit: Payer: Self-pay | Admitting: Internal Medicine

## 2022-07-25 ENCOUNTER — Ambulatory Visit
Admission: RE | Admit: 2022-07-25 | Discharge: 2022-07-25 | Disposition: A | Discharge status: Home / Self Care | Payer: Medicare HMO | Source: Ambulatory Visit | Attending: Internal Medicine | Admitting: Internal Medicine

## 2022-07-25 DIAGNOSIS — R079 Chest pain, unspecified: Secondary | ICD-10-CM | POA: Diagnosis not present

## 2022-07-25 DIAGNOSIS — R030 Elevated blood-pressure reading, without diagnosis of hypertension: Secondary | ICD-10-CM | POA: Diagnosis not present

## 2022-07-25 DIAGNOSIS — E7800 Pure hypercholesterolemia, unspecified: Secondary | ICD-10-CM | POA: Diagnosis not present

## 2022-07-25 DIAGNOSIS — S2241XA Multiple fractures of ribs, right side, initial encounter for closed fracture: Secondary | ICD-10-CM | POA: Diagnosis not present

## 2022-07-25 DIAGNOSIS — Z85820 Personal history of malignant melanoma of skin: Secondary | ICD-10-CM | POA: Diagnosis not present

## 2022-07-25 DIAGNOSIS — I700 Atherosclerosis of aorta: Secondary | ICD-10-CM | POA: Diagnosis not present

## 2022-08-30 DIAGNOSIS — H04123 Dry eye syndrome of bilateral lacrimal glands: Secondary | ICD-10-CM | POA: Diagnosis not present

## 2022-08-30 DIAGNOSIS — H40013 Open angle with borderline findings, low risk, bilateral: Secondary | ICD-10-CM | POA: Diagnosis not present

## 2022-08-30 DIAGNOSIS — Z961 Presence of intraocular lens: Secondary | ICD-10-CM | POA: Diagnosis not present

## 2022-08-30 DIAGNOSIS — H59811 Chorioretinal scars after surgery for detachment, right eye: Secondary | ICD-10-CM | POA: Diagnosis not present

## 2022-09-08 DIAGNOSIS — Z0100 Encounter for examination of eyes and vision without abnormal findings: Secondary | ICD-10-CM | POA: Diagnosis not present

## 2022-09-23 DIAGNOSIS — R3129 Other microscopic hematuria: Secondary | ICD-10-CM | POA: Diagnosis not present

## 2022-09-23 DIAGNOSIS — N400 Enlarged prostate without lower urinary tract symptoms: Secondary | ICD-10-CM | POA: Diagnosis not present

## 2022-09-23 DIAGNOSIS — K5730 Diverticulosis of large intestine without perforation or abscess without bleeding: Secondary | ICD-10-CM | POA: Diagnosis not present

## 2022-09-23 DIAGNOSIS — R350 Frequency of micturition: Secondary | ICD-10-CM | POA: Diagnosis not present

## 2022-09-23 DIAGNOSIS — N3289 Other specified disorders of bladder: Secondary | ICD-10-CM | POA: Diagnosis not present

## 2022-09-23 DIAGNOSIS — N200 Calculus of kidney: Secondary | ICD-10-CM | POA: Diagnosis not present

## 2022-09-23 DIAGNOSIS — R3912 Poor urinary stream: Secondary | ICD-10-CM | POA: Diagnosis not present

## 2022-10-12 DIAGNOSIS — N401 Enlarged prostate with lower urinary tract symptoms: Secondary | ICD-10-CM | POA: Diagnosis not present

## 2022-10-12 DIAGNOSIS — R9341 Abnormal radiologic findings on diagnostic imaging of renal pelvis, ureter, or bladder: Secondary | ICD-10-CM | POA: Diagnosis not present

## 2022-10-12 DIAGNOSIS — R3914 Feeling of incomplete bladder emptying: Secondary | ICD-10-CM | POA: Diagnosis not present

## 2022-10-12 DIAGNOSIS — R413 Other amnesia: Secondary | ICD-10-CM | POA: Diagnosis not present

## 2022-10-12 DIAGNOSIS — G629 Polyneuropathy, unspecified: Secondary | ICD-10-CM | POA: Diagnosis not present

## 2022-10-12 DIAGNOSIS — N3090 Cystitis, unspecified without hematuria: Secondary | ICD-10-CM | POA: Diagnosis not present

## 2022-10-12 DIAGNOSIS — T378X5A Adverse effect of other specified systemic anti-infectives and antiparasitics, initial encounter: Secondary | ICD-10-CM | POA: Diagnosis not present

## 2022-10-20 ENCOUNTER — Encounter: Payer: Self-pay | Admitting: Neurology

## 2022-10-24 ENCOUNTER — Encounter: Payer: Self-pay | Admitting: Neurology

## 2022-10-24 ENCOUNTER — Ambulatory Visit: Payer: Medicare HMO | Admitting: Neurology

## 2022-10-24 ENCOUNTER — Other Ambulatory Visit (INDEPENDENT_AMBULATORY_CARE_PROVIDER_SITE_OTHER): Payer: Medicare HMO

## 2022-10-24 VITALS — BP 133/79 | HR 92 | Ht 75.0 in | Wt 173.2 lb

## 2022-10-24 DIAGNOSIS — G629 Polyneuropathy, unspecified: Secondary | ICD-10-CM

## 2022-10-24 DIAGNOSIS — R269 Unspecified abnormalities of gait and mobility: Secondary | ICD-10-CM | POA: Diagnosis not present

## 2022-10-24 LAB — TSH: TSH: 1.11 u[IU]/mL (ref 0.35–5.50)

## 2022-10-24 LAB — SEDIMENTATION RATE: Sed Rate: 5 mm/hr (ref 0–20)

## 2022-10-24 LAB — FOLATE: Folate: >23.9 ng/mL (ref >5.9)

## 2022-10-24 LAB — C-REACTIVE PROTEIN: CRP: <1 mg/dL (ref 0.5–20.0)

## 2022-10-24 NOTE — Progress Notes (Addendum)
[facility] Clinic Note - Initial Visit   Date: 10/24/2022   Alex Nguyen MRN: [id number] DOB: Aug 16, 1946   Dear Dr. Andria Frames:  Thank you for your kind referral of Alex Nguyen for consultation of feet numbness/tingling. Although his history is well known to you, please allow [place] to reiterate it for the purpose of our medical record. The patient was accompanied to the clinic by self.    Alex Nguyen is a 77 y.o.  male with hyperlipidemia presenting for evaluation of bilateral feet numbness/tingling.   IMPRESSION/PLAN:  onset of bilateral feet paresthesias, suggestive of neuropathy.  Symptoms started following flouroquinolone and although uncommon, neuropathy can be seen with this class of antibiotics.  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  To further evaluate his symptoms, he will return for electrodiagnostic testing.    - ESR, CRP, TSH, folate, copper, SPEP with IFE, ANA  - EMG bilateral legs  - Start physical therapy for balance training  Further recommendations pending results.  ------------------------------------------------------------- History of present illness: In late January, he was diagnosed with UTI and started on ciproflxacin.  Around the first week of February, he began having numbness, tingling, and burning sensation involving the soles of the feet.  Symptoms gradually started making him feel unsteady, preventing him from running because of concern of falling.  Prior to his, he has been an avid runner.  Last week, he noticed mild weakness of the hands, reporting that he dropped a mirror.  He feels that when he forms a tight fist, his hands feet numb.    He lives alone in a one-level home.  Wife passed away 13 years ago. He is retired Community education officer at [employer].     Past Medical History:  Diagnosis Date   Hyperlipidemia    Hypertension    Left ureteral calculus      Past Surgical History:  Procedure Laterality Date   CATARACT EXTRACTION W/ INTRAOCULAR LENS  IMPLANT, BILATERAL     CYSTOSCOPY WITH RETROGRADE PYELOGRAM, URETEROSCOPY AND STENT PLACEMENT Left 08/07/2013   Procedure: CYSTOSCOPY WITH RETROGRADE PYELOGRAM, URETEROSCOPY AND  STENT PLACEMENT;  Surgeon: Molli Hazard, MD;  Location: [facility];  Service: Urology;  Laterality: Left;   HOLMIUM LASER APPLICATION Left [id number]   Procedure: HOLMIUM LASER APPLICATION;  Surgeon: Molli Hazard, MD;  Location: [facility];  Service: Urology;  Laterality: Left;   KNEE ARTHROSCOPY W/ MENISCAL REPAIR Right 1960'S   LESION REMOVAL Left 12/17/2019   Procedure: EXCISION OF BUTTOCK SEBACEOUS CYST;  Surgeon: Greer Pickerel, MD;  Location: [facility];  Service: General;  Laterality: Left;   RETINAL DETACHMENT SURGERY Right 2011     Medications:  Outpatient Encounter Medications as of 10/24/2022  Medication Sig Note   atorvastatin (LIPITOR) 10 MG tablet Take 10 mg by mouth daily.    Multiple Vitamin (DAILY MULTIVITAMIN PO) Take 1 tablet by mouth. Once daily  08/08/2013: Wants to talk to doctor concerning this medications as to weather he should take or not take due to kidney stones   No facility-administered encounter medications on file as of 10/24/2022.    Allergies: No Known Allergies  Family History: Family History  Problem Relation Age of Onset   Stroke Mother     Social History: Social History   Tobacco Use   Smoking status: Never   Smokeless tobacco: Never  Substance Use Topics   Alcohol use: Yes  Comment: RARE   Drug use: No   Social History   Social History Narrative   Not on file    Vital Signs:  BP 133/79   Pulse 92   Ht '6\' 3"'$  (1.905 m)   Wt 173 lb 3.2 oz (78.6 kg)   SpO2 99%   BMI 21.65 kg/m   Neurological Exam: MENTAL STATUS including orientation to time, place, person, recent and remote memory, attention  span and concentration, language, and fund of knowledge is normal.  Speech is not dysarthric.  CRANIAL NERVES: II:  No visual field defects.     III-IV-VI: Pupils equal round and reactive to light.  Normal conjugate, extra-ocular eye movements in all directions of gaze.  No nystagmus.  Mild left ptosis (old following cataract surgery) V:  Normal facial sensation.    VII:  Normal facial symmetry and movements.   VIII:  Normal hearing and vestibular function.   IX-X:  Normal palatal movement.   XI:  Normal shoulder shrug and head rotation.   XII:  Normal tongue strength and range of motion, no deviation or fasciculation.  MOTOR:  No atrophy, fasciculations or abnormal movements.  No pronator drift.   Upper Extremity:  Right  Left  Deltoid  5/5   5/5   Biceps  5/5   5/5   Triceps  5/5   5/5   Wrist extensors  5/5   5/5   Wrist flexors  5/5   5/5   Finger extensors  5/5   5/5   Finger flexors  5/5   5/5   Dorsal interossei  5/5   5/5   Abductor pollicis  5/5   5/5   Tone (Ashworth scale)  0  0   Lower Extremity:  Right  Left  Hip flexors  5/5   5/5   Hip extensors  5/5   5/5   Knee flexors  5/5   5/5   Knee extensors  5/5   5/5   Dorsiflexors  5/5   5/5   Plantarflexors  5/5   5/5   Toe extensors  5/5   5/5   Toe flexors  5/5   5/5   Tone (Ashworth scale)  0  0   MSRs:                                           Right        Left brachioradialis 2+  2+  biceps 2+  2+  triceps 2+  2+  patellar 1+  1+  ankle jerk 0  0  Hoffman no  no  plantar response down  down   SENSORY:  Vibration is diminished to 25% at the feet bilaterally.  Temperature is mildly reduced in the feet.  Pin prick is normal throughout. Romberg's sign absent.   COORDINATION/GAIT: Normal finger-to- nose-finger.  Intact rapid alternating movements bilaterally..  Gait narrow based and stable. Stressed gait intact, unsteady with tandem gait but able to perform.     Thank you for allowing me to participate in  patient's care.  If I can answer any additional questions, I would be pleased to do so.    Sincerely,    Salem Lembke K. Posey Pronto, DO

## 2022-10-26 ENCOUNTER — Ambulatory Visit: Payer: Medicare HMO | Attending: Neurology | Admitting: Physical Therapy

## 2022-10-26 ENCOUNTER — Encounter: Payer: Self-pay | Admitting: Physical Therapy

## 2022-10-26 ENCOUNTER — Other Ambulatory Visit: Payer: Self-pay

## 2022-10-26 DIAGNOSIS — R2681 Unsteadiness on feet: Secondary | ICD-10-CM | POA: Diagnosis not present

## 2022-10-26 DIAGNOSIS — G629 Polyneuropathy, unspecified: Secondary | ICD-10-CM | POA: Diagnosis not present

## 2022-10-26 DIAGNOSIS — R269 Unspecified abnormalities of gait and mobility: Secondary | ICD-10-CM | POA: Insufficient documentation

## 2022-10-26 NOTE — Therapy (Signed)
OUTPATIENT PHYSICAL THERAPY NEURO EVALUATION   Patient Name: Alex Nguyen MRN: [id number] DOB:1946/01/07, 77 y.o., male Today's Date: 10/26/2022   PCP: Lavone Orn, MD  REFERRING PROVIDER: Alda Berthold, DO  END OF SESSION:  PT End of Session - 10/26/22 0945     Visit Number 1    Number of Visits 5    Date for PT Re-Evaluation 11/23/22    Authorization Type [employer]    PT Start Time [id number]    PT Stop Time 0943    PT Time Calculation (min) 52 min    Equipment Utilized During Treatment Gait belt    Activity Tolerance Patient tolerated treatment well    Behavior During Therapy WFL for tasks assessed/performed             Past Medical History:  Diagnosis Date   Hyperlipidemia    Hypertension    Left ureteral calculus    Past Surgical History:  Procedure Laterality Date   CATARACT EXTRACTION W/ INTRAOCULAR LENS  IMPLANT, BILATERAL     CYSTOSCOPY WITH RETROGRADE PYELOGRAM, URETEROSCOPY AND STENT PLACEMENT Left 08/07/2013   Procedure: CYSTOSCOPY WITH RETROGRADE PYELOGRAM, URETEROSCOPY AND  STENT PLACEMENT;  Surgeon: Molli Hazard, MD;  Location: [facility];  Service: Urology;  Laterality: Left;   HOLMIUM LASER APPLICATION Left [id number]   Procedure: HOLMIUM LASER APPLICATION;  Surgeon: Molli Hazard, MD;  Location: [facility];  Service: Urology;  Laterality: Left;   KNEE ARTHROSCOPY W/ MENISCAL REPAIR Right 1960'S   LESION REMOVAL Left 12/17/2019   Procedure: EXCISION OF BUTTOCK SEBACEOUS CYST;  Surgeon: Greer Pickerel, MD;  Location: [place];  Service: General;  Laterality: Left;   RETINAL DETACHMENT SURGERY Right 2011   Patient Active Problem List   Diagnosis Date Noted   KNEE PAIN, RIGHT, CHRONIC 06/28/2010   UNEQUAL LEG LENGTH 06/28/2010   ABNORMALITY OF GAIT 06/28/2010    ONSET DATE: Early February 2024  REFERRING DIAG: G62.9 (ICD-10-CM) - Neuropathy R26.9 (ICD-10-CM) - Abnormality of  gait  THERAPY DIAG:  Unsteadiness on feet  Rationale for Evaluation and Treatment: Rehabilitation  SUBJECTIVE:                                                                                                                                                                                             SUBJECTIVE STATEMENT: 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 Reports that the FDA noted possible tendon rupture as another possible side effect of this medication and is concerned about that. Reports difficulty with balance,  tingling in hands after making a fist, and occasionally drops things. Been a runner for 40 years and worried that he may not be able to run anymore d/t changes in stride/running quality. Has not run in the past month. Previous regimen was running 3 miles 3x/week. Has been trying to get 150 minutes of walking or running/week on asphalt. Reports unsteadiness with sudden turns. Reports remaining R knee pain and stiffness s/p surgery in 1977.  Pt accompanied by: self  PERTINENT HISTORY: HLD, HTN, R knee scope with meniscal repair  PAIN:  Are you having pain? No  PRECAUTIONS: None  WEIGHT BEARING RESTRICTIONS: No  FALLS: Has patient fallen in last 6 months? No  LIVING ENVIRONMENT: Lives with: lives alone Lives in: House/apartment Stairs:  3 steps to enter without handrail, 8 steps with 1 handrail in the back ; 1 story home  Has following equipment at home: Grab bars  PLOF: Independent  PATIENT GOALS: improve balance, see if he is safe to run  OBJECTIVE:   DIAGNOSTIC FINDINGS: none recent  COGNITION: Overall cognitive status: Within functional limits for tasks assessed   SENSATION: Intact to light touch in B Les   COORDINATION: Alternating pronation/supination: intact B Alternating toe tap: intact B Heel to shin: intact B    POSTURE: forward head  LOWER EXTREMITY ROM:     Active   Right Eval Left Eval  Hip flexion    Hip extension    Hip abduction    Hip adduction    Hip internal rotation    Hip external rotation    Knee flexion    Knee extension Lacking full extension neutral  Ankle dorsiflexion 17 *knee flexed d/t limited knee ROM 17 *knee straight  Ankle plantarflexion    Ankle inversion    Ankle eversion     (Blank rows = not tested)  LOWER EXTREMITY MMT:    MMT Right Eval Left Eval  Hip flexion 4+ 5  Hip extension    Hip abduction 5 5  Hip adduction 4+ 4+  Hip internal rotation    Hip external rotation    Knee flexion 4+ 4+  Knee extension 5 5  Ankle dorsiflexion 5 5  Ankle plantarflexion 4+ *limited ROM but 20 reps  4+ *limited ROM but 20 reps   Ankle inversion    Ankle eversion    (Blank rows = not tested)   GAIT: Gait pattern:  reduced R knee extension at heel strike resulting in occasionally catching R on floor  Assistive device utilized: None Level of assistance: Complete Independence   FUNCTIONAL TESTS:   [facility] PT Assessment - 10/26/22 0001       Functional Gait  Assessment   Gait assessed  Yes    Gait Level Surface Walks 20 ft in less than 5.5 sec, no assistive devices, good speed, no evidence for imbalance, normal gait pattern, deviates no more than 6 in outside of the 12 in walkway width.    Change in Gait Speed Able to change speed, demonstrates mild gait deviations, deviates 6-10 in outside of the 12 in walkway width, or no gait deviations, unable to achieve a major change in velocity, or uses a change in velocity, or uses an assistive device.   occasionally catching R foot   Gait with Horizontal Head Turns Performs head turns smoothly with slight change in gait velocity (eg, minor disruption to smooth gait path), deviates 6-10 in outside 12 in walkway width, or uses an assistive device.  Gait with Vertical Head Turns Performs task with slight change in gait velocity (eg, minor disruption to smooth gait path), deviates 6 -  10 in outside 12 in walkway width or uses assistive device   occasionally catching R foot   Gait and Pivot Turn Pivot turns safely within 3 sec and stops quickly with no loss of balance.    Step Over Obstacle Is able to step over 2 stacked shoe boxes taped together (9 in total height) without changing gait speed. No evidence of imbalance.    Gait with Narrow Base of Support Is able to ambulate for 10 steps heel to toe with no staggering.    Gait with Eyes Closed Walks 20 ft, uses assistive device, slower speed, mild gait deviations, deviates 6-10 in outside 12 in walkway width. Ambulates 20 ft in less than 9 sec but greater than 7 sec.    Ambulating Backwards Walks 20 ft, no assistive devices, good speed, no evidence for imbalance, normal gait    Steps Alternating feet, no rail.    Total Score 26               M-CTSIB  Condition 1: Firm Surface, EO 30 Sec, Normal Sway  Condition 2: Firm Surface, EC 30 Sec, Mild Sway  Condition 3: Foam Surface, EO 30 Sec, Mild Sway  Condition 4: Foam Surface, EC 30 Sec, Mild and Moderate Sway     TODAY'S TREATMENT:                                                                                                                              DATE: 10/26/22    PATIENT EDUCATION: Education details: prognosis, POC, HEP- at counter top for safety Person educated: Patient Education method: Explanation, Demonstration, Tactile cues, Verbal cues, and Handouts Education comprehension: verbalized understanding  HOME EXERCISE PROGRAM: Access Code: [email or web address] URL: [email or web address] Date: 10/26/2022 Prepared by: [facility]  Program Notes perform at counter top for safety  Exercises - Single Leg Heel Raise  - 1 x daily - 5 x weekly - 2 sets - 10-15 reps - Romberg Stance Eyes Closed on Foam Pad  - 1 x daily - 5 x weekly - 2 sets - 30 sec hold - Gait with Head Nods and Turns on Grass  - 1 x daily - 5 x  weekly - 2 sets - 10 reps - Seated Hamstring Stretch  - 1 x daily - 5 x weekly - 2 sets - 30 sec hold  GOALS: Goals reviewed with patient? Yes  SHORT TERM GOALS: Target date: 11/09/2022  Patient to be independent with initial HEP. Baseline: HEP initiated Goal status: INITIAL    LONG TERM GOALS: Target date: 11/23/2022  Patient to be independent with advanced HEP. Baseline: Not yet initiated  Goal status: INITIAL  Patient to demonstrate mild sway with M-CTSIB condition with eyes closed/foam surface in order to improve safety in  environments with uneven surfaces and dim lighting. Baseline: mild-moderate Goal status: INITIAL  Patient to score at least 28/30 on FGA in order to decrease risk of falls. Baseline: 26/30 Goal status: INITIAL  Patient to demonstrate safe and efficient running pattern on level surfaces in order to return to running regimen.  Baseline: NT Goal status: INITIAL    ASSESSMENT:  CLINICAL IMPRESSION:  Patient is a 77 y/o M presenting to OPPT with c/o new onset of imbalance and LE>UE N/T for the past couple weeks secondary to antibiotic use. At baseline, patient was running 9 miles/week but has discontinued since this recent episode; notes concerns over possible side effects of tendon rupture from this medication. Patient today presenting with reduced B calf strength, limited R knee extension ROM, gait deviations, and mild imbalance. Most imbalance evident with gait with head movements, walking with EC, and multisensory balance testing. Patient was educated on gentle strengthening and balance HEP and reported understanding. Would benefit from skilled PT services 1 x/week for 4 weeks to address aforementioned impairments in order to optimize level of function.    OBJECTIVE IMPAIRMENTS: Abnormal gait, decreased balance, decreased ROM, decreased strength, impaired flexibility, and postural dysfunction.   ACTIVITY LIMITATIONS: carrying, lifting, and  standing  PARTICIPATION LIMITATIONS: cleaning, community activity, and yard work  PERSONAL FACTORS: Age, Fitness, Past/current experiences, Time since onset of injury/illness/exacerbation, and 3+ comorbidities: HLD, HTN, R knee scope with meniscal repair  are also affecting patient's functional outcome.   REHAB POTENTIAL: Good  CLINICAL DECISION MAKING: Evolving/moderate complexity  EVALUATION COMPLEXITY: Moderate  PLAN:  PT FREQUENCY: 1x/week  PT DURATION: 4 weeks  PLANNED INTERVENTIONS: Therapeutic exercises, Therapeutic activity, Neuromuscular re-education, Balance training, Gait training, Patient/Family education, Self Care, Joint mobilization, Stair training, Vestibular training, Canalith repositioning, Aquatic Therapy, Dry Needling, Electrical stimulation, Cryotherapy, Moist heat, Taping, Manual therapy, and Re-evaluation  PLAN FOR NEXT SESSION: review HEP, progress calf strength, R HS length, multisensory balance and balance with head turns; observe running mechanics if pt is comfortable   Janene Harvey, PT, DPT 10/26/22 9:59 AM  [facility] [address], [place], [place] [address] Phone # [phone] Fax # [phone]

## 2022-10-28 ENCOUNTER — Ambulatory Visit: Payer: Medicare HMO | Admitting: Neurology

## 2022-10-28 DIAGNOSIS — R269 Unspecified abnormalities of gait and mobility: Secondary | ICD-10-CM

## 2022-10-28 DIAGNOSIS — M5410 Radiculopathy, site unspecified: Secondary | ICD-10-CM | POA: Diagnosis not present

## 2022-10-28 DIAGNOSIS — G629 Polyneuropathy, unspecified: Secondary | ICD-10-CM

## 2022-10-28 NOTE — Progress Notes (Signed)
    Follow-up Visit   Date: 10/28/2022    Alex Nguyen MRN: [id number] DOB: 1946/04/01    Alex Nguyen is a 77 y.o. Caucasian male with hyperlipidemia returning to the clinic for follow-up of bilateral feet numbness/tingling.  The patient was accompanied to the clinic by self.   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  Although symptoms may be secondary to flouroquinolone, to be complete, I would like to check CSF for signs of inflammation.  If present, a trial of IVIG may be indicated.    - Lumbar puncture - check CSF cell count and diff, protein, glucose, IgG index, oligoclonal bands, myelin basic protein, cytology  - Continue PT  Further recommendations pending results.   --------------------------------------------- History of present illness: In late January, he was diagnosed with UTI and started on ciproflxacin.  Around the first week of February, he began having numbness, tingling, and burning sensation involving the soles of the feet.  Symptoms gradually started making him feel unsteady, preventing him from running because of concern of falling.  Prior to his, he has been an avid runner.  Last week, he noticed mild weakness of the hands, reporting that he dropped a mirror.  He feels that when he forms a tight fist, his hands feet numb.     He lives alone in a one-level home.  Wife passed away 13 years ago. He is retired Community education officer at [employer].    UPDATE 10/28/2022:  He is here for electrodiagnostic testing of the legs, see below for results.  He has started physical therapy.  There has been no worsening of balance or feet numbness or tingling.  Medications:  Current Outpatient Medications on File Prior to Visit  Medication Sig Dispense Refill   atorvastatin (LIPITOR) 10 MG tablet Take 10 mg by mouth daily.     Multiple Vitamin (DAILY MULTIVITAMIN PO)  Take 1 tablet by mouth. Once daily      No current facility-administered medications on file prior to visit.    Allergies: No Known Allergies  Vital Signs:  There were no vitals taken for this visit.   Neurological Exam: See exam on 10/24/2022  Data: NCS/EMG of the legs 10/28/2022: The electrophysiologic findings are suggestive of a polyradiculoneuropathy given the presence of prolonged late responses and temporal dispersion of the right tibial nerve. Interestingly, sensory responses are normal. Correlate clinically   Labs 10/24/2022: ESR 5, CRP <1.0, TSH 1.11, folate >23, SPEP with IFE no M protein.  Copper - pending    Thank you for allowing me to participate in patient's care.  If I can answer any additional questions, I would be pleased to do so.    Sincerely,    Coleen Cardiff K. Posey Pronto, DO

## 2022-10-28 NOTE — Procedures (Signed)
[facility]  [address], [address]  [place], [place] [address] Tel: [phone] Fax: [phone] Test Date:  10/28/2022  Patient: Alex Nguyen DOB: 07-12-1946 Physician: Narda Amber, DO  Sex: Male Height: '6\' 3"'$  Ref Phys: Narda Amber, DO  ID#: [id number]   Technician:    History: This is a 77 year old man referred for evaluation of gait unsteadiness and feet numbness.  NCV & EMG Findings: Extensive electrodiagnostic testing of the right lower extremity and additional studies of the left shows:  Bilateral sural and superficial peroneal sensory responses are within normal limits. Bilateral peroneal and left tibial motor responses are within normal limits.  Right tibial motor response shows asymmetrically reduced amplitude, slowed conduction velocity, and temporal dispersion. Bilateral tibial H-reflex and F-wave studies show prolonged latencies. Chronic motor axon loss changes are seen affecting the muscles below the knee, without accompanying active denervation.  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  Interestingly, sensory responses are normal.  Correlate clinically.   ___________________________ Narda Amber, DO    Nerve Conduction Studies   Stim Site NR Peak (ms) Norm Peak (ms) O-P Amp (V) Norm O-P Amp  Left Sup Peroneal Anti Sensory (Ant Lat Mall)  32 C  12 cm    3.3 <4.6 6.9 >3  Right Sup Peroneal Anti Sensory (Ant Lat Mall)  32 C  12 cm    3.2 <4.6 8.8 >3  Left Sural Anti Sensory (Lat Mall)  32 C  Calf    3.7 <4.6 12.9 >3  Right Sural Anti Sensory (Lat Mall)  32 C  Calf    3.7 <4.6 13.2 >3     Stim Site NR Onset (ms) Norm Onset (ms) O-P Amp (mV) Norm O-P Amp Site1 Site2 Delta-0 (ms) Dist (cm) Vel (m/s) Norm Vel (m/s)  Left Peroneal Motor (Ext Dig Brev)  32 C  Ankle    5.9 <6.0 4.2 >2.5 B Fib Ankle  9.1 38.0 42 >40  B Fib    15.0  3.2  Poplt B Fib 2.2 10.0 45 >40  Poplt    17.2  3.0         Right Peroneal Motor (Ext Dig Brev)  32 C  Ankle    4.8 <6.0 6.1 >2.5 B Fib Ankle 8.2 35.0 43 >40  B Fib    13.0  5.4  Poplt B Fib 1.7 9.0 53 >40  Poplt    14.7  5.3         Left Peroneal TA Motor (Tib Ant)  32 C  Fib Head    2.8 <4.5 6.0 >3 Poplit Fib Head 2.0 10.0 50 >40  Poplit    4.8 <5.7 5.9         Right Peroneal TA Motor (Tib Ant)  32 C  Fib Head    3.0 <4.5 6.0 >3 Poplit Fib Head 1.9 10.0 53 >40  Poplit    4.9 <5.7 6.0         Left Tibial Motor (Abd Hall Brev)  32 C  Ankle    5.0 <6.0 10.0 >4 Knee Ankle 9.4 43.0 46 >40  Knee    14.4  7.2         Right Tibial Motor (Abd Hall Brev)  32 C  Ankle    4.8 <6.0 5.2 >4 Knee Ankle 12.5 43.0 *34 >40  Knee    17.3  4.1  Electromyography   Side Muscle Ins.Act Fibs Fasc Recrt Amp Dur Poly Activation Comment  Right AntTibialis Nml Nml Nml *1- *1+ *1+ *1+ Nml N/A  Right Gastroc Nml Nml Nml *1- *1+ *1+ *1+ Nml N/A  Right Flex Dig Long Nml Nml Nml *1- *1+ *1+ *1+ Nml N/A  Right RectFemoris Nml Nml Nml Nml Nml Nml Nml Nml N/A  Right BicepsFemS Nml Nml Nml Nml Nml Nml Nml Nml N/A  Right GluteusMed Nml Nml Nml Nml Nml Nml Nml Nml N/A  Right Lumbo Parasp Low Nml Nml Nml Nml Nml Nml Nml Nml N/A  Left AntTibialis Nml Nml Nml *1- *1+ *1+ *1+ Nml N/A  Left Gastroc Nml Nml Nml *1- *1+ *1+ *1+ Nml N/A  Left Flex Dig Long Nml Nml Nml *1- *1+ *1+ *1+ Nml N/A  Left RectFemoris Nml Nml Nml Nml Nml Nml Nml Nml N/A  Left GluteusMed Nml Nml Nml Nml Nml Nml Nml Nml N/A  Left BicepsFemS Nml Nml Nml Nml Nml Nml Nml Nml N/A      Waveforms:

## 2022-11-01 ENCOUNTER — Telehealth: Payer: Self-pay

## 2022-11-01 ENCOUNTER — Ambulatory Visit: Payer: Medicare HMO | Attending: Neurology | Admitting: Physical Therapy

## 2022-11-01 ENCOUNTER — Encounter: Payer: Self-pay | Admitting: Physical Therapy

## 2022-11-01 DIAGNOSIS — R2681 Unsteadiness on feet: Secondary | ICD-10-CM | POA: Insufficient documentation

## 2022-11-01 DIAGNOSIS — M5410 Radiculopathy, site unspecified: Secondary | ICD-10-CM

## 2022-11-01 DIAGNOSIS — R269 Unspecified abnormalities of gait and mobility: Secondary | ICD-10-CM

## 2022-11-01 NOTE — Telephone Encounter (Signed)
-----   Message from Alda Berthold, DO sent at 10/28/2022  3:57 PM EST ----- Please order LP - check CSF cell count and diff, protein, glucose, IgG index, oligoclonal bands, myelin basic protein, cytology Thanks

## 2022-11-01 NOTE — Therapy (Signed)
OUTPATIENT PHYSICAL THERAPY NEURO TREATMENT   Patient Name: Alex Nguyen MRN: [id number] DOB:September 12, 1945, 77 y.o., male Today's Date: 11/01/2022   PCP: Lavone Orn, MD  REFERRING PROVIDER: Alda Berthold, DO  END OF SESSION:  PT End of Session - 11/01/22 0835     Visit Number 2    Number of Visits 5    Date for PT Re-Evaluation 11/23/22    Authorization Type [employer]    PT Start Time [phone]    PT Stop Time 0926    PT Time Calculation (min) 40 min    Activity Tolerance Patient tolerated treatment well    Behavior During Therapy WFL for tasks assessed/performed              Past Medical History:  Diagnosis Date   Hyperlipidemia    Hypertension    Left ureteral calculus    Past Surgical History:  Procedure Laterality Date   CATARACT EXTRACTION W/ INTRAOCULAR LENS  IMPLANT, BILATERAL     CYSTOSCOPY WITH RETROGRADE PYELOGRAM, URETEROSCOPY AND STENT PLACEMENT Left 08/07/2013   Procedure: CYSTOSCOPY WITH RETROGRADE PYELOGRAM, URETEROSCOPY AND  STENT PLACEMENT;  Surgeon: Molli Hazard, MD;  Location: [facility];  Service: Urology;  Laterality: Left;   HOLMIUM LASER APPLICATION Left [id number]   Procedure: HOLMIUM LASER APPLICATION;  Surgeon: Molli Hazard, MD;  [facility];  Service: Urology;  Laterality: Left;   KNEE ARTHROSCOPY W/ MENISCAL REPAIR Right 1960'S   LESION REMOVAL Left 12/17/2019   Procedure: EXCISION OF BUTTOCK SEBACEOUS CYST;  Surgeon: Greer Pickerel, MD;  Location: [facility];  Service: General;  Laterality: Left;   RETINAL DETACHMENT SURGERY Right 2011   Patient Active Problem List   Diagnosis Date Noted   KNEE PAIN, RIGHT, CHRONIC 06/28/2010   UNEQUAL LEG LENGTH 06/28/2010   ABNORMALITY OF GAIT 06/28/2010    ONSET DATE: Early February 2024  REFERRING DIAG: G62.9 (ICD-10-CM) - Neuropathy R26.9 (ICD-10-CM) - Abnormality of gait  THERAPY DIAG:  Unsteadiness on  feet  Rationale for Evaluation and Treatment: Rehabilitation  SUBJECTIVE:                                                                                                                                                                                             SUBJECTIVE STATEMENT:  Having some insomnia, ankles are feeling OK. Took a medication that I think accounts for the numbness I'm having but I'm afraid to start back due to FDA recommendations of tendon rupture. No falls or close calls. Haven't been running as much, used to try to get 150 minutes of  exercise in per week. Not sure what's safe for me to do in context of numbness of feet. May be getting a back ache from one stretch.   Pt accompanied by: self  PERTINENT HISTORY: HLD, HTN, R knee scope with meniscal repair  PAIN:  Are you having pain? Yes: NPRS scale: 2/10 Pain location: low back  Pain description: soreness Aggravating factors: HS stretch  Relieving factors: comes and goes   PRECAUTIONS: None  WEIGHT BEARING RESTRICTIONS: No  FALLS: Has patient fallen in last 6 months? No  LIVING ENVIRONMENT: Lives with: lives alone Lives in: House/apartment Stairs:  3 steps to enter without handrail, 8 steps with 1 handrail in the back ; 1 story home  Has following equipment at home: Grab bars  PLOF: Independent  PATIENT GOALS: improve balance, see if he is safe to run  OBJECTIVE:   DIAGNOSTIC FINDINGS: none recent  COGNITION: Overall cognitive status: Within functional limits for tasks assessed   SENSATION: Intact to light touch in B Les   COORDINATION: Alternating pronation/supination: intact B Alternating toe tap: intact B Heel to shin: intact B    POSTURE: forward head  LOWER EXTREMITY ROM:     Active  Right Eval Left Eval  Hip flexion    Hip extension    Hip abduction    Hip adduction    Hip internal rotation    Hip external rotation    Knee flexion    Knee extension Lacking full extension  neutral  Ankle dorsiflexion 17 *knee flexed d/t limited knee ROM 17 *knee straight  Ankle plantarflexion    Ankle inversion    Ankle eversion     (Blank rows = not tested)  LOWER EXTREMITY MMT:    MMT Right Eval Left Eval  Hip flexion 4+ 5  Hip extension    Hip abduction 5 5  Hip adduction 4+ 4+  Hip internal rotation    Hip external rotation    Knee flexion 4+ 4+  Knee extension 5 5  Ankle dorsiflexion 5 5  Ankle plantarflexion 4+ *limited ROM but 20 reps  4+ *limited ROM but 20 reps   Ankle inversion    Ankle eversion    (Blank rows = not tested)   GAIT: Gait pattern:  reduced R knee extension at heel strike resulting in occasionally catching R on floor  Assistive device utilized: None Level of assistance: Complete Independence   FUNCTIONAL TESTS:       M-CTSIB  Condition 1: Firm Surface, EO 30 Sec, Normal Sway  Condition 2: Firm Surface, EC 30 Sec, Mild Sway  Condition 3: Foam Surface, EO 30 Sec, Mild Sway  Condition 4: Foam Surface, EC 30 Sec, Mild and Moderate Sway     TODAY'S TREATMENT:                                                                                                                              DATE:  11/01/22  Jogging assessment- limited knee extension R knee still, limited toe off and hard heel strike. Perhaps Mild pronation noted as well. Exam of wear pattern on shoes shows hard heel strike B, he will bring personal running shoes in next time   TherEx  HS stretches on steps 3x30 seconds B B heel raise + U eccentric lower x12 B AP rocks on rockerboard x20 minimal UE support  Speed walks around gym for warm-up prior to jogging assessment   NMR  Tandem stance 3x30 seconds B  Tandem walks in // bars 4 rounds solid surface SLS on blue foam pad with other foot on 6 inch box 3x30  Standing marches on blue foam pad x20  Selfcare  Progressive loading and eccentric work for tendon health Basic return to running program in context  of concern for tendon tear or rupture from antibiotics, 3-5 minutes on TM for 1.5 weeks no incline if pain free could progress to 10 minutes on TM/no incline, etc. Do not force TM work through pain.  TM running better than over ground right now as a precaution- less impact on joints   PATIENT EDUCATION: Education details: prognosis, POC, HEP- at counter top for safety Person educated: Patient Education method: Explanation, Demonstration, Tactile cues, Verbal cues, and Handouts Education comprehension: verbalized understanding  HOME EXERCISE PROGRAM: Access Code: [email or web address] URL: [email or web address] Date: 10/26/2022 Prepared by: [facility]  Program Notes perform at counter top for safety  Exercises - Single Leg Heel Raise  - 1 x daily - 5 x weekly - 2 sets - 10-15 reps - Romberg Stance Eyes Closed on Foam Pad  - 1 x daily - 5 x weekly - 2 sets - 30 sec hold - Gait with Head Nods and Turns on Grass  - 1 x daily - 5 x weekly - 2 sets - 10 reps - Seated Hamstring Stretch  - 1 x daily - 5 x weekly - 2 sets - 30 sec hold  GOALS: Goals reviewed with patient? Yes  SHORT TERM GOALS: Target date: 11/09/2022  Patient to be independent with initial HEP. Baseline: HEP initiated Goal status: INITIAL    LONG TERM GOALS: Target date: 11/23/2022  Patient to be independent with advanced HEP. Baseline: Not yet initiated  Goal status: INITIAL  Patient to demonstrate mild sway with M-CTSIB condition with eyes closed/foam surface in order to improve safety in environments with uneven surfaces and dim lighting. Baseline: mild-moderate Goal status: INITIAL  Patient to score at least 28/30 on FGA in order to decrease risk of falls. Baseline: 26/30 Goal status: INITIAL  Patient to demonstrate safe and efficient running pattern on level surfaces in order to return to running regimen.  Baseline: NT Goal status:  INITIAL    ASSESSMENT:  CLINICAL IMPRESSION:  Richardson Landry arrives today doing OK, still with concern of tendon rupture from antibiotics and some apprehension about return to running. Worked on some stretches and then warmed up prior to checking walking/jogging mechanics in more depth. Education given about risk of tendon rupture after certain antibiotics as well as PT interventions to help reduce/manage this risk. Worked on balance and proprioception training as time allowed today too- will continue to progress as able.    OBJECTIVE IMPAIRMENTS: Abnormal gait, decreased balance, decreased ROM, decreased strength, impaired flexibility, and postural dysfunction.   ACTIVITY LIMITATIONS: carrying, lifting, and standing  PARTICIPATION LIMITATIONS: cleaning, community activity, and yard work  PERSONAL FACTORS: Age, Fitness, Past/current experiences,  Time since onset of injury/illness/exacerbation, and 3+ comorbidities: HLD, HTN, R knee scope with meniscal repair  are also affecting patient's functional outcome.   REHAB POTENTIAL: Good  CLINICAL DECISION MAKING: Evolving/moderate complexity  EVALUATION COMPLEXITY: Moderate  PLAN:  PT FREQUENCY: 1x/week  PT DURATION: 4 weeks  PLANNED INTERVENTIONS: Therapeutic exercises, Therapeutic activity, Neuromuscular re-education, Balance training, Gait training, Patient/Family education, Self Care, Joint mobilization, Stair training, Vestibular training, Canalith repositioning, Aquatic Therapy, Dry Needling, Electrical stimulation, Cryotherapy, Moist heat, Taping, Manual therapy, and Re-evaluation  PLAN FOR NEXT SESSION: review HEP, progress calf strength, R HS length, multisensory balance and balance with head turns; observe running mechanics if pt is comfortable  Deniece Ree PT DPT PN2   [facility] [address], [place], [place] [address] Phone # [phone] Fax # [phone]

## 2022-11-02 ENCOUNTER — Encounter: Payer: Self-pay | Admitting: Neurology

## 2022-11-03 ENCOUNTER — Telehealth: Payer: Self-pay

## 2022-11-03 DIAGNOSIS — R269 Unspecified abnormalities of gait and mobility: Secondary | ICD-10-CM

## 2022-11-03 NOTE — Telephone Encounter (Signed)
Best to have him schedule a follow-up visit next week and we can discuss management options. Thank you.

## 2022-11-03 NOTE — Telephone Encounter (Signed)
Spoke to [employer]. Due to protocol at [facility] patient will need a CT Head prior to having his Lumbar Puncture.  [facility] protocol requires past imaging within 7 years. Will order CT head without contrast.  Called and spoke to patient and he had a few concerns. Patient stated that he was once put on Antibiotics and when he read the side effects and noticed a side effect that he had was peripheral neuropathy that developed a week after he took the antibiotic.   Patient also stated that if Neuropathy doesn't have a cure why should he subject himself to these tests if nothing can be done for it? He states that the outcome won't suggest a treatment that will help him.   Patient was concerned that if he doesn't do the test reccommended he still wants to be able to see Dr. Posey Pronto. I told patient that he definitely can still see Dr. Posey Pronto. Patient stated that he would be OK scheduling an appointment with Dr. Posey Pronto to go over all his concerns with her.   Patient is aware that I will inform Dr. Posey Pronto of his concerns and get back to him.

## 2022-11-04 NOTE — Telephone Encounter (Signed)
Called patient and explained to him that per Dr. Posey Pronto it is best he schedule a follow up with Dr. Posey Pronto and to discuss management options.   Patient verbalized understanding and is aware that someone from the front will give him a call to get him scheduled.

## 2022-11-05 LAB — PROTEIN ELECTROPHORESIS, SERUM
Albumin ELP: 4.3 g/dL (ref 3.8–4.8)
Alpha 1: 0.3 g/dL (ref 0.2–0.3)
Alpha 2: 0.6 g/dL (ref 0.5–0.9)
Beta 2: 0.3 g/dL (ref 0.2–0.5)
Beta Globulin: 0.4 g/dL (ref 0.4–0.6)
Gamma Globulin: 1 g/dL (ref 0.8–1.7)
Total Protein: 6.8 g/dL (ref 6.1–8.1)

## 2022-11-05 LAB — IMMUNOFIXATION ELECTROPHORESIS
IgG (Immunoglobin G), Serum: 1115 mg/dL (ref 600–1540)
IgM, Serum: 51 mg/dL (ref 50–300)
Immunoglobulin A: 154 mg/dL (ref 70–320)

## 2022-11-05 LAB — COPPER, SERUM: Copper: 163 ug/dL (ref 70–175)

## 2022-11-07 NOTE — Therapy (Signed)
OUTPATIENT PHYSICAL THERAPY NEURO TREATMENT   Patient Name: Alex Nguyen MRN: [id number] DOB:12/21/1945, 77 y.o., male Today's Date: 11/08/2022   PCP: Lavone Orn, MD  REFERRING PROVIDER: Alda Berthold, DO  END OF SESSION:  PT End of Session - 11/08/22 0931     Visit Number 3    Number of Visits 5    Date for PT Re-Evaluation 11/23/22    Authorization Type [employer]    Authorization Time Period approved 5 PT visits from 10/26/2022-11/23/2022    Authorization - Visit Number 3    Authorization - Number of Visits 5    PT Start Time 0850    PT Stop Time 0928    PT Time Calculation (min) 38 min    Equipment Utilized During Treatment Gait belt    Activity Tolerance Patient tolerated treatment well    Behavior During Therapy WFL for tasks assessed/performed               Past Medical History:  Diagnosis Date   Hyperlipidemia    Hypertension    Left ureteral calculus    Past Surgical History:  Procedure Laterality Date   CATARACT EXTRACTION W/ INTRAOCULAR LENS  IMPLANT, BILATERAL     CYSTOSCOPY WITH RETROGRADE PYELOGRAM, URETEROSCOPY AND STENT PLACEMENT Left 08/07/2013   Procedure: CYSTOSCOPY WITH RETROGRADE PYELOGRAM, URETEROSCOPY AND  STENT PLACEMENT;  Surgeon: Molli Hazard, MD;  Location: [facility];  Service: Urology;  Laterality: Left;   HOLMIUM LASER APPLICATION Left [id number]   Procedure: HOLMIUM LASER APPLICATION;  Surgeon: Molli Hazard, MD;  Location: [facility];  Service: Urology;  Laterality: Left;   KNEE ARTHROSCOPY W/ MENISCAL REPAIR Right 1960'S   LESION REMOVAL Left 12/17/2019   Procedure: EXCISION OF BUTTOCK SEBACEOUS CYST;  Surgeon: Greer Pickerel, MD;  Location: [place];  Service: General;  Laterality: Left;   RETINAL DETACHMENT SURGERY Right 2011   Patient Active Problem List   Diagnosis Date Noted   KNEE PAIN, RIGHT, CHRONIC 06/28/2010   UNEQUAL LEG LENGTH 06/28/2010    ABNORMALITY OF GAIT 06/28/2010    ONSET DATE: Early February 2024  REFERRING DIAG: 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 Thinking of doing that every other day. Does not feel comfortable/safe on the treadmill. Stopped doing the HS stretch d/t LBP- not sure if this was the cause as he has a hx of LBP.   Pt accompanied by: self  PERTINENT HISTORY: HLD, HTN, R knee scope with meniscal repair  PAIN:  Are you having pain? Yes: NPRS scale: 2/10 Pain location: B LEs Pain description: "annoying" Aggravating factors: night time Relieving factors: comes and goes   PRECAUTIONS: None  WEIGHT BEARING RESTRICTIONS: No  FALLS: Has patient fallen in last 6 months? No  LIVING ENVIRONMENT: Lives with: lives alone Lives in: House/apartment Stairs:  3 steps to enter without handrail, 8 steps with 1 handrail in the back ; 1 story home  Has following equipment at home: Grab bars  PLOF: Independent  PATIENT GOALS: improve balance, see if he is safe to run  OBJECTIVE:       TODAY'S TREATMENT: 11/08/22 Activity Comments  SLS  L 21 sec, R 29 sec *increased wobble on R  Supine HS stretch with strap 30" each Good tolerance   Observation of walking and running mechanics on outside sidewalk [id number] Short step length, R foot falling into pronation however excessive supination evident with normal pace walking; pt with difficulty demonstrating longer stride length when prompted  Backwards walking, walking on toes/heels, gait with head turns/nods   Better stability with head turns/nods      HOME EXERCISE PROGRAM: Access Code: [email or web address]: [email or web address] Date: 11/08/2022 Prepared by: [facility] Program Notes perform at counter top for safety  Exercises - Single Leg Heel Raise  - 1 x daily - 5 x weekly - 2 sets - 10-15 reps - Romberg Stance Eyes Closed on Foam Pad  - 1 x daily - 5 x weekly - 2 sets - 30 sec hold - Gait with Head Nods and Turns on Grass  - 1 x daily - 5 x weekly - 2 sets - 10 reps - Supine Hamstring Stretch with Strap  - 1 x daily - 5 x weekly - 2 sets - 30 sec hold   PATIENT EDUCATION: Education details: explained pt's balance testing indicates decreased risk of falls and balance trials today showed good stability. Encouraged pt to continue calf strengthening to reduce risk of tendon rupture (d/t pt's concern about tendon changes after taking his meds), however explained to patient that accidents may happen. Encouraged every other day jogging with slow progression of duration starting at 5 min and to stop if pain occurs.HEP update Person educated: Patient Education method: Explanation, Demonstration, Tactile cues, Verbal cues, and Handouts Education comprehension: verbalized understanding and returned demonstration    Below measures were taken at time of initial evaluation unless otherwise specified:   DIAGNOSTIC FINDINGS: none recent  COGNITION: Overall cognitive status: Within functional limits for tasks assessed   SENSATION: Intact to light touch in B Les   COORDINATION: Alternating pronation/supination: intact B Alternating toe tap: intact B Heel to shin: intact B    POSTURE: forward head  LOWER EXTREMITY ROM:     Active  Right Eval Left Eval  Hip flexion    Hip extension    Hip abduction    Hip adduction    Hip internal rotation    Hip external rotation    Knee flexion    Knee extension Lacking full extension neutral   Ankle dorsiflexion 17 *knee flexed d/t limited  knee ROM 17 *knee straight  Ankle plantarflexion    Ankle inversion    Ankle eversion     (Blank rows = not tested)  LOWER EXTREMITY MMT:    MMT Right Eval Left Eval  Hip flexion 4+ 5  Hip extension    Hip abduction 5 5  Hip adduction 4+ 4+  Hip internal rotation    Hip external rotation    Knee flexion 4+ 4+  Knee extension 5 5  Ankle dorsiflexion 5 5  Ankle plantarflexion 4+ *limited ROM but 20 reps  4+ *limited ROM but 20 reps   Ankle inversion    Ankle eversion    (Blank rows = not tested)   GAIT: Gait pattern:  reduced R knee extension at heel strike resulting in occasionally catching R on floor  Assistive device utilized: None Level of assistance: Complete Independence   FUNCTIONAL TESTS:       M-CTSIB  Condition 1: Firm Surface, EO 30 Sec, Normal Sway  Condition 2: Firm Surface, EC 30 Sec, Mild Sway  Condition 3: Foam Surface, EO 30 Sec, Mild Sway  Condition 4: Foam Surface, EC 30 Sec, Mild and Moderate Sway       GOALS: Goals reviewed with patient? Yes  SHORT TERM GOALS: Target date: 11/09/2022  Patient to be independent with initial HEP. Baseline: HEP initiated Goal status: IN PROGRESS    LONG TERM GOALS: Target date: 11/23/2022  Patient to be independent with advanced HEP. Baseline: Not yet initiated  Goal status: IN PROGRESS  Patient to demonstrate mild sway with M-CTSIB condition with eyes closed/foam surface in order to improve safety in environments with uneven surfaces and dim lighting. Baseline: mild-moderate Goal status: IN PROGRESS  Patient to score at least 28/30 on FGA in order to decrease risk of falls. Baseline: 26/30 Goal status: IN PROGRESS  Patient to demonstrate safe and efficient running pattern on level surfaces in order to return to running regimen.  Baseline: NT Goal status: IN PROGRESS    ASSESSMENT:  CLINICAL IMPRESSION:  Patient arrived to session with  report of trialing jogging on his usual running path for 5 minutes without issues since last session. Noted some LBP with HS stretching, thus modified this to supine stretch which was better-tolerated. Observed running mechanics again today which showed shorted stride length B and R foot falling into pronation. Cueing patient to increase step length was not successful. Patient demonstrated improved stability with balance activities today while walking, thus feel that patient will be safe to continue working on a slow progression of walking as balance testing revealed decreased risk of falls. Patient reported understanding and without complaints upon leaving.    OBJECTIVE IMPAIRMENTS: Abnormal gait, decreased balance, decreased ROM, decreased strength, impaired flexibility, and postural dysfunction.   ACTIVITY LIMITATIONS: carrying, lifting, and standing  PARTICIPATION LIMITATIONS: cleaning, community activity, and yard work  PERSONAL FACTORS: Age, Fitness, Past/current experiences, Time since onset of injury/illness/exacerbation, and 3+ comorbidities: HLD, HTN, R knee scope with meniscal repair  are also affecting patient's functional outcome.   REHAB POTENTIAL: Good  CLINICAL DECISION MAKING: Evolving/moderate complexity  EVALUATION COMPLEXITY: Moderate  PLAN:  PT FREQUENCY: 1x/week  PT DURATION: 4 weeks  PLANNED INTERVENTIONS: Therapeutic exercises, Therapeutic activity, Neuromuscular re-education, Balance training, Gait training, Patient/Family education, Self Care, Joint mobilization, Stair training, Vestibular training, Canalith repositioning, Aquatic Therapy, Dry Needling, Electrical stimulation, Cryotherapy, Moist heat, Taping, Manual therapy, and Re-evaluation  PLAN FOR NEXT SESSION:  progress calf strength, R HS length, multisensory balance  and balance with head turns  Janene Harvey, PT, DPT 11/08/22 9:39 AM  [facility] at [facility] [place], [place], [place] [address] Phone # [phone] Fax # [phone]

## 2022-11-08 ENCOUNTER — Ambulatory Visit: Payer: Medicare HMO | Admitting: Physical Therapy

## 2022-11-08 ENCOUNTER — Encounter: Payer: Self-pay | Admitting: Physical Therapy

## 2022-11-08 DIAGNOSIS — R2681 Unsteadiness on feet: Secondary | ICD-10-CM

## 2022-11-09 DIAGNOSIS — R350 Frequency of micturition: Secondary | ICD-10-CM | POA: Diagnosis not present

## 2022-11-09 DIAGNOSIS — R3914 Feeling of incomplete bladder emptying: Secondary | ICD-10-CM | POA: Diagnosis not present

## 2022-11-09 DIAGNOSIS — N390 Urinary tract infection, site not specified: Secondary | ICD-10-CM | POA: Diagnosis not present

## 2022-11-14 ENCOUNTER — Ambulatory Visit: Payer: Medicare HMO | Admitting: Neurology

## 2022-11-14 NOTE — Therapy (Signed)
OUTPATIENT PHYSICAL THERAPY NEURO TREATMENT   Patient Name: Alex Nguyen MRN: [id number] DOB:11/09/1945, 77 y.o., male Today's Date: 11/14/2022   PCP: Lavone Orn, MD  REFERRING PROVIDER: Alda Berthold, DO  END OF SESSION:      Past Medical History:  Diagnosis Date   Hyperlipidemia    Hypertension    Left ureteral calculus    Past Surgical History:  Procedure Laterality Date   CATARACT EXTRACTION W/ INTRAOCULAR LENS  IMPLANT, BILATERAL     CYSTOSCOPY WITH RETROGRADE PYELOGRAM, URETEROSCOPY AND STENT PLACEMENT Left 08/07/2013   Procedure: CYSTOSCOPY WITH RETROGRADE PYELOGRAM, URETEROSCOPY AND  STENT PLACEMENT;  Surgeon: Molli Hazard, MD;  Location: [facility];  Service: Urology;  Laterality: Left;   HOLMIUM LASER APPLICATION Left [id number]   Procedure: HOLMIUM LASER APPLICATION;  Surgeon: Molli Hazard, MD;  Location: [facility];  Service: Urology;  Laterality: Left;   KNEE ARTHROSCOPY W/ MENISCAL REPAIR Right 1960'S   LESION REMOVAL Left 12/17/2019   Procedure: EXCISION OF BUTTOCK SEBACEOUS CYST;  Surgeon: Greer Pickerel, MD;  Location: [place];  Service: General;  Laterality: Left;   RETINAL DETACHMENT SURGERY Right 2011   Patient Active Problem List   Diagnosis Date Noted   KNEE PAIN, RIGHT, CHRONIC 06/28/2010   UNEQUAL LEG LENGTH 06/28/2010   ABNORMALITY OF GAIT 06/28/2010    ONSET DATE: Early February 2024  REFERRING DIAG: G62.9 (ICD-10-CM) - Neuropathy R26.9 (ICD-10-CM) - Abnormality of gait  THERAPY DIAG:  No diagnosis found.  Rationale for Evaluation and Treatment: Rehabilitation  SUBJECTIVE:                                                                                                                                                                                             SUBJECTIVE STATEMENT: Reports that he tried jogging for 5 minutes on his regular running path and has been  walking 25 minutes. Thinking of doing that every other day. Does not feel comfortable/safe on the treadmill. Stopped doing the HS stretch d/t LBP- not sure if this was the cause as he has a hx of LBP.   Pt accompanied by: self  PERTINENT HISTORY: HLD, HTN, R knee scope with meniscal repair  PAIN:  Are you having pain? Yes: NPRS scale: 2/10 Pain location: B LEs Pain description: "annoying" Aggravating factors: night time Relieving factors: comes and goes   PRECAUTIONS: None  WEIGHT BEARING RESTRICTIONS: No  FALLS: Has patient fallen in last 6 months? No  LIVING ENVIRONMENT: Lives with: lives alone Lives in: House/apartment Stairs:  3 steps to enter without handrail, 8 steps with  1 handrail in the back ; 1 story home  Has following equipment at home: Grab bars  PLOF: Independent  PATIENT GOALS: improve balance, see if he is safe to run  OBJECTIVE:     TODAY'S TREATMENT: 11/15/22 Activity Comments                        TODAY'S TREATMENT: 11/08/22 Activity Comments  SLS  L 21 sec, R 29 sec *increased wobble on R  Supine HS stretch with strap 30" each Good tolerance   Observation of walking and running mechanics on outside sidewalk x300-453ft Short step length, R foot falling into pronation however excessive supination evident with normal pace walking; pt with difficulty demonstrating longer stride length when prompted  Backwards walking, walking on toes/heels, gait with head turns/nods  Better stability with head turns/nods      HOME EXERCISE PROGRAM: Access Code: [email or web address] URL: [email or web address] Date: 11/08/2022 Prepared by: [facility]  Program Notes perform at counter top for safety  Exercises - Single Leg Heel Raise  - 1 x daily - 5 x weekly - 2 sets - 10-15 reps - Romberg Stance Eyes Closed on Foam Pad  - 1 x daily - 5 x weekly - 2 sets - 30 sec hold - Gait with Head Nods and Turns on Grass  - 1 x  daily - 5 x weekly - 2 sets - 10 reps - Supine Hamstring Stretch with Strap  - 1 x daily - 5 x weekly - 2 sets - 30 sec hold   PATIENT EDUCATION: Education details: explained pt's balance testing indicates decreased risk of falls and balance trials today showed good stability. Encouraged pt to continue calf strengthening to reduce risk of tendon rupture (d/t pt's concern about tendon changes after taking his meds), however explained to patient that accidents may happen. Encouraged every other day jogging with slow progression of duration starting at 5 min and to stop if pain occurs.HEP update Person educated: Patient Education method: Explanation, Demonstration, Tactile cues, Verbal cues, and Handouts Education comprehension: verbalized understanding and returned demonstration    Below measures were taken at time of initial evaluation unless otherwise specified:   DIAGNOSTIC FINDINGS: none recent  COGNITION: Overall cognitive status: Within functional limits for tasks assessed   SENSATION: Intact to light touch in B Les   COORDINATION: Alternating pronation/supination: intact B Alternating toe tap: intact B Heel to shin: intact B    POSTURE: forward head  LOWER EXTREMITY ROM:     Active  Right Eval Left Eval  Hip flexion    Hip extension    Hip abduction    Hip adduction    Hip internal rotation    Hip external rotation    Knee flexion    Knee extension Lacking full extension neutral  Ankle dorsiflexion 17 *knee flexed d/t limited knee ROM 17 *knee straight  Ankle plantarflexion    Ankle inversion    Ankle eversion     (Blank rows = not tested)  LOWER EXTREMITY MMT:    MMT Right Eval Left Eval  Hip flexion 4+ 5  Hip extension    Hip abduction 5 5  Hip adduction 4+ 4+  Hip internal rotation    Hip external rotation    Knee flexion 4+ 4+  Knee extension 5 5  Ankle dorsiflexion 5 5  Ankle plantarflexion 4+ *limited ROM but 20 reps  4+ *limited ROM but 20  reps   Ankle inversion    Ankle eversion    (Blank rows = not tested)   GAIT: Gait pattern:  reduced R knee extension at heel strike resulting in occasionally catching R on floor  Assistive device utilized: None Level of assistance: Complete Independence   FUNCTIONAL TESTS:       M-CTSIB  Condition 1: Firm Surface, EO 30 Sec, Normal Sway  Condition 2: Firm Surface, EC 30 Sec, Mild Sway  Condition 3: Foam Surface, EO 30 Sec, Mild Sway  Condition 4: Foam Surface, EC 30 Sec, Mild and Moderate Sway       GOALS: Goals reviewed with patient? Yes  SHORT TERM GOALS: Target date: 11/09/2022  Patient to be independent with initial HEP. Baseline: HEP initiated Goal status: IN PROGRESS    LONG TERM GOALS: Target date: 11/23/2022  Patient to be independent with advanced HEP. Baseline: Not yet initiated  Goal status: IN PROGRESS  Patient to demonstrate mild sway with M-CTSIB condition with eyes closed/foam surface in order to improve safety in environments with uneven surfaces and dim lighting. Baseline: mild-moderate Goal status: IN PROGRESS  Patient to score at least 28/30 on FGA in order to decrease risk of falls. Baseline: 26/30 Goal status: IN PROGRESS  Patient to demonstrate safe and efficient running pattern on level surfaces in order to return to running regimen.  Baseline: NT Goal status: IN PROGRESS    ASSESSMENT:  CLINICAL IMPRESSION:  Patient arrived to session with report of trialing jogging on his usual running path for 5 minutes without issues since last session. Noted some LBP with HS stretching, thus modified this to supine stretch which was better-tolerated. Observed running mechanics again today which showed shorted stride length B and R foot falling into pronation. Cueing patient to increase step length was not successful. Patient demonstrated improved stability with balance activities today while walking, thus feel that patient will be safe to  continue working on a slow progression of walking as balance testing revealed decreased risk of falls. Patient reported understanding and without complaints upon leaving.    OBJECTIVE IMPAIRMENTS: Abnormal gait, decreased balance, decreased ROM, decreased strength, impaired flexibility, and postural dysfunction.   ACTIVITY LIMITATIONS: carrying, lifting, and standing  PARTICIPATION LIMITATIONS: cleaning, community activity, and yard work  PERSONAL FACTORS: Age, Fitness, Past/current experiences, Time since onset of injury/illness/exacerbation, and 3+ comorbidities: HLD, HTN, R knee scope with meniscal repair  are also affecting patient's functional outcome.   REHAB POTENTIAL: Good  CLINICAL DECISION MAKING: Evolving/moderate complexity  EVALUATION COMPLEXITY: Moderate  PLAN:  PT FREQUENCY: 1x/week  PT DURATION: 4 weeks  PLANNED INTERVENTIONS: Therapeutic exercises, Therapeutic activity, Neuromuscular re-education, Balance training, Gait training, Patient/Family education, Self Care, Joint mobilization, Stair training, Vestibular training, Canalith repositioning, Aquatic Therapy, Dry Needling, Electrical stimulation, Cryotherapy, Moist heat, Taping, Manual therapy, and Re-evaluation  PLAN FOR NEXT SESSION:  progress calf strength, R HS length, multisensory balance and balance with head turns  Janene Harvey, PT, DPT 11/14/22 1:00 PM  [facility] at [facility] [address] [place], [place] [address] Phone # [phone] Fax # [phone]

## 2022-11-15 ENCOUNTER — Encounter: Payer: Self-pay | Admitting: Physical Therapy

## 2022-11-15 ENCOUNTER — Ambulatory Visit: Payer: Medicare HMO | Admitting: Physical Therapy

## 2022-11-15 DIAGNOSIS — R2681 Unsteadiness on feet: Secondary | ICD-10-CM | POA: Diagnosis not present

## 2022-11-16 ENCOUNTER — Ambulatory Visit: Payer: Medicare HMO | Admitting: Neurology

## 2022-11-16 ENCOUNTER — Encounter: Payer: Self-pay | Admitting: Neurology

## 2022-11-16 VITALS — BP 149/80 | HR 83 | Resp 18 | Ht 75.0 in | Wt 177.0 lb

## 2022-11-16 DIAGNOSIS — M5410 Radiculopathy, site unspecified: Secondary | ICD-10-CM

## 2022-11-16 DIAGNOSIS — R269 Unspecified abnormalities of gait and mobility: Secondary | ICD-10-CM

## 2022-11-16 NOTE — Patient Instructions (Signed)
I will see you back in 4 months

## 2022-11-16 NOTE — Progress Notes (Signed)
Follow-up Visit   Date: 11/16/2022    Alex Nguyen MRN: [id number] DOB: 1945-12-22    Alex Nguyen is a 77 y.o. Caucasian male with hyperlipidemia returning to the clinic for follow-up of bilateral feet numbness/tingling.  The patient was accompanied to the clinic by self.   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  EMG shows prolonged late responses and right tibial temporal dispersion.  Fortunately, patient is starting to have improved balance and able to be more active.  There has been no clinical change in distal paresthesias, however, on exam there is mild improvement.  At this juncture, I suggest that we monitor him clinically.  CSF testing can be considered, if there is worsening symptoms.  My overall suspicion for primary immune-mediated neuropathy is low.  Continue PT All questions answered   Return to clinic in 4 months, or sooner as needed --------------------------------------------- History of present illness: In late January, he was diagnosed with UTI and started on ciproflxacin.  Around the first week of February, he began having numbness, tingling, and burning sensation involving the soles of the feet.  Symptoms gradually started making him feel unsteady, preventing him from running because of concern of falling.  Prior to his, he has been an avid runner.  Last week, he noticed mild weakness of the hands, reporting that he dropped a mirror.  He feels that when he forms a tight fist, his hands feet numb.     He lives alone in a one-level home.  Wife passed away 13 years ago. He is retired Community education officer at [employer].    UPDATE 11/16/2022:  He is here for follow-up visit. He reports that he has completed 4 sessions of PT and doing very well.  He is jogging 8 minutes and able to walk up to 30-min..  There has not been a significant change in burning, numbness of the toes.  No weakness or new neurological symptoms.  He  has a list of questions which were addressed.   Medications:  Current Outpatient Medications on File Prior to Visit  Medication Sig Dispense Refill   atorvastatin (LIPITOR) 10 MG tablet Take 10 mg by mouth daily.     Multiple Vitamin (DAILY MULTIVITAMIN PO) Take 1 tablet by mouth. Once daily      No current facility-administered medications on file prior to visit.    Allergies: No Known Allergies  Vital Signs:  BP (!) 149/80   Pulse 83   Resp 18   Ht 6\' 3"  (1.905 m)   Wt 177 lb (80.3 kg)   SpO2 100%   BMI 22.12 kg/m    Neurological Exam: MENTAL STATUS including orientation to time, place, person, recent and remote memory, attention span and concentration, language, and fund of knowledge is normal.  Speech is not dysarthric.  CRANIAL NERVES:   Normal conjugate, extra-ocular eye movements in all directions of gaze.  Mild left ptosis (old). Face is symmetric.  MOTOR:  Motor strength is 5/5 in all extremities, except including distally.  No atrophy, fasciculations or abnormal movements.  No pronator drift.  Tone is normal.    MSRs:  Reflexes are 2+/4 throughout, except 1+/4 at the ankles (improved).  SENSORY:  Intact to vibration at the knees, reduced to 70% at the ankles and 40% at the great toe bilaterally.  COORDINATION/GAIT:   Intact rapid alternating movements bilaterally.  Gait narrow based and stable. Stressed and tandem gait intact.    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 Interestingly, sensory responses are normal. Correlate clinically   Labs 10/24/2022: ESR 5, CRP <1.0, TSH 1.11, folate >23, SPEP with IFE no M protein.  [place] 163  Total time spent reviewing records, interview, history/exam, documentation, and coordination of care on day of encounter:  30 min    Thank you for allowing me to  participate in patient's care.  If I can answer any additional questions, I would be pleased to do so.    Sincerely,    Fronie Holstein K. Posey Pronto, DO

## 2022-11-17 ENCOUNTER — Encounter: Payer: Self-pay | Admitting: Podiatry

## 2022-11-17 ENCOUNTER — Ambulatory Visit: Payer: Medicare HMO | Admitting: Podiatry

## 2022-11-17 DIAGNOSIS — R269 Unspecified abnormalities of gait and mobility: Secondary | ICD-10-CM | POA: Diagnosis not present

## 2022-11-17 DIAGNOSIS — M722 Plantar fascial fibromatosis: Secondary | ICD-10-CM

## 2022-11-17 DIAGNOSIS — M2170 Unequal limb length (acquired), unspecified site: Secondary | ICD-10-CM

## 2022-11-17 NOTE — Progress Notes (Signed)
  Subjective:  Patient ID: Alex Nguyen, male    DOB: 04-27-1946,  MRN: [id number] HPI Chief Complaint  Patient presents with   Foot Pain    Bilateral feet - neuropathy x years, suspected got the neuropathy from an adverse effect from an antibiotic, active runner, wanting advice on keeping feet in good shape, gait is off-limb length difference   New Patient (Initial Visit)    77 y.o. male presents with the above complaint.   ROS: Denies fever chills nausea vomit muscle aches pains calf pain back pain chest pain shortness of breath.  Past Medical History:  Diagnosis Date   Hyperlipidemia    Hypertension    Left ureteral calculus    Past Surgical History:  Procedure Laterality Date   CATARACT EXTRACTION W/ INTRAOCULAR LENS  IMPLANT, BILATERAL     CYSTOSCOPY WITH RETROGRADE PYELOGRAM, URETEROSCOPY AND STENT PLACEMENT Left 08/07/2013   Procedure: CYSTOSCOPY WITH RETROGRADE PYELOGRAM, URETEROSCOPY AND  STENT PLACEMENT;  Surgeon: Alex Hazard, MD;  Location: [facility];  Service: Urology;  Laterality: Left;   HOLMIUM LASER APPLICATION Left [id number]   Procedure: HOLMIUM LASER APPLICATION;  Surgeon: Alex Hazard, MD;  Location: [facility];  Service: Urology;  Laterality: Left;   KNEE ARTHROSCOPY W/ MENISCAL REPAIR Right 1960'S   LESION REMOVAL Left 12/17/2019   Procedure: EXCISION OF BUTTOCK SEBACEOUS CYST;  Surgeon: Alex Pickerel, MD;  Location: [place];  Service: General;  Laterality: Left;   RETINAL DETACHMENT SURGERY Right 2011    Current Outpatient Medications:    atorvastatin (LIPITOR) 10 MG tablet, Take 10 mg by mouth daily., Disp: , Rfl:    Multiple Vitamin (DAILY MULTIVITAMIN PO), Take 1 tablet by mouth. Once daily , Disp: , Rfl:   No Known Allergies Review of Systems Objective:  There were no vitals filed for this visit.  General: Well developed, nourished, in no acute distress, alert and oriented x3    Dermatological: Skin is warm, dry and supple bilateral. Nails x 10 are well maintained; remaining integument appears unremarkable at this time. There are no open sores, no preulcerative lesions, no rash or signs of infection present.  Vascular: Dorsalis Pedis artery and Posterior Tibial artery pedal pulses are 2/4 bilateral with immedate capillary fill time. Pedal hair growth present. No varicosities and no lower extremity edema present bilateral.   Neruologic: Grossly intact via light touch bilateral. Vibratory intact via tuning fork bilateral. Protective threshold with Semmes Wienstein monofilament intact to all pedal sites bilateral. Patellar and Achilles deep tendon reflexes 2+ bilateral. No Babinski or clonus noted bilateral.  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  Musculoskeletal: No gross boney pedal deformities bilateral. No pain, crepitus, or limitation noted with foot and ankle range of motion bilateral. Muscular strength 5/5 in all groups tested bilateral.  Leg length discrepancy shorter right side by approximately 3/16 or 1/4 inch.  Gait: Unassisted, Nonantalgic.     Radiographs:  None  Assessment & Plan:   Assessment: Neuropathy and leg length discrepancy right  Plan: Get him into support orthotic with 3/16 left on the right side only.  He is scheduled to have his orthotics casted in the near future and I will follow-up with him once those come in.     Alex Nguyen T. [place], [place]

## 2022-12-09 ENCOUNTER — Ambulatory Visit (INDEPENDENT_AMBULATORY_CARE_PROVIDER_SITE_OTHER): Payer: Medicare HMO

## 2022-12-09 DIAGNOSIS — M2170 Unequal limb length (acquired), unspecified site: Secondary | ICD-10-CM

## 2022-12-09 DIAGNOSIS — M722 Plantar fascial fibromatosis: Secondary | ICD-10-CM

## 2022-12-09 DIAGNOSIS — R269 Unspecified abnormalities of gait and mobility: Secondary | ICD-10-CM

## 2022-12-09 NOTE — Progress Notes (Signed)
Patient presents today to be casted for custom molded orthotics. HYATT is the treating physician.  Impression foam cast was taken. ABN signed.  Patient info-  Shoe size: 12  Shoe style: ATHLETIC  Height: 6FT 3IN  Weight: 170  Insurance: [employer]   Patient will be notified once orthotics arrive in office and reappoint for fitting at that time.

## 2023-01-09 ENCOUNTER — Ambulatory Visit (INDEPENDENT_AMBULATORY_CARE_PROVIDER_SITE_OTHER): Payer: Medicare HMO

## 2023-01-09 DIAGNOSIS — R269 Unspecified abnormalities of gait and mobility: Secondary | ICD-10-CM

## 2023-01-09 DIAGNOSIS — M2170 Unequal limb length (acquired), unspecified site: Secondary | ICD-10-CM

## 2023-01-09 NOTE — Progress Notes (Signed)
Patient presents today to pick up custom molded foot orthotics recommended by Dr. Al Corpus.   Orthotics were not dispensed. Patient is going to contact his insurance to get an estimated cost and will call back to reschedule pick up.

## 2023-01-16 ENCOUNTER — Encounter: Payer: Self-pay | Admitting: Podiatry

## 2023-01-25 DIAGNOSIS — Z85820 Personal history of malignant melanoma of skin: Secondary | ICD-10-CM | POA: Diagnosis not present

## 2023-01-25 DIAGNOSIS — Z08 Encounter for follow-up examination after completed treatment for malignant neoplasm: Secondary | ICD-10-CM | POA: Diagnosis not present

## 2023-01-25 DIAGNOSIS — Z85828 Personal history of other malignant neoplasm of skin: Secondary | ICD-10-CM | POA: Diagnosis not present

## 2023-01-25 DIAGNOSIS — R229 Localized swelling, mass and lump, unspecified: Secondary | ICD-10-CM | POA: Diagnosis not present

## 2023-01-25 DIAGNOSIS — L578 Other skin changes due to chronic exposure to nonionizing radiation: Secondary | ICD-10-CM | POA: Diagnosis not present

## 2023-01-25 DIAGNOSIS — C44529 Squamous cell carcinoma of skin of other part of trunk: Secondary | ICD-10-CM | POA: Diagnosis not present

## 2023-01-25 DIAGNOSIS — D485 Neoplasm of uncertain behavior of skin: Secondary | ICD-10-CM | POA: Diagnosis not present

## 2023-01-25 DIAGNOSIS — L570 Actinic keratosis: Secondary | ICD-10-CM | POA: Diagnosis not present

## 2023-01-25 DIAGNOSIS — C44629 Squamous cell carcinoma of skin of left upper limb, including shoulder: Secondary | ICD-10-CM | POA: Diagnosis not present

## 2023-01-25 DIAGNOSIS — L821 Other seborrheic keratosis: Secondary | ICD-10-CM | POA: Diagnosis not present

## 2023-02-08 ENCOUNTER — Ambulatory Visit (INDEPENDENT_AMBULATORY_CARE_PROVIDER_SITE_OTHER): Payer: Medicare HMO | Admitting: Podiatry

## 2023-02-08 DIAGNOSIS — R269 Unspecified abnormalities of gait and mobility: Secondary | ICD-10-CM

## 2023-02-08 DIAGNOSIS — M2170 Unequal limb length (acquired), unspecified site: Secondary | ICD-10-CM

## 2023-02-08 NOTE — Progress Notes (Signed)
Patient presents today to pick up custom orthotics   Patient was dispensed 1 pair of custom orthotics and given break in instructions.   Patient advised of any problems to follow up with our office.

## 2023-02-16 DIAGNOSIS — I700 Atherosclerosis of aorta: Secondary | ICD-10-CM | POA: Diagnosis not present

## 2023-02-16 DIAGNOSIS — L570 Actinic keratosis: Secondary | ICD-10-CM | POA: Diagnosis not present

## 2023-02-16 DIAGNOSIS — Z85820 Personal history of malignant melanoma of skin: Secondary | ICD-10-CM | POA: Diagnosis not present

## 2023-02-16 DIAGNOSIS — N401 Enlarged prostate with lower urinary tract symptoms: Secondary | ICD-10-CM | POA: Diagnosis not present

## 2023-02-16 DIAGNOSIS — Z1331 Encounter for screening for depression: Secondary | ICD-10-CM | POA: Diagnosis not present

## 2023-02-16 DIAGNOSIS — Z0000 Encounter for general adult medical examination without abnormal findings: Secondary | ICD-10-CM | POA: Diagnosis not present

## 2023-02-16 DIAGNOSIS — I10 Essential (primary) hypertension: Secondary | ICD-10-CM | POA: Diagnosis not present

## 2023-02-16 DIAGNOSIS — E7800 Pure hypercholesterolemia, unspecified: Secondary | ICD-10-CM | POA: Diagnosis not present

## 2023-02-16 DIAGNOSIS — R030 Elevated blood-pressure reading, without diagnosis of hypertension: Secondary | ICD-10-CM | POA: Diagnosis not present

## 2023-02-16 DIAGNOSIS — G4700 Insomnia, unspecified: Secondary | ICD-10-CM | POA: Diagnosis not present

## 2023-02-17 DIAGNOSIS — D045 Carcinoma in situ of skin of trunk: Secondary | ICD-10-CM | POA: Diagnosis not present

## 2023-03-14 ENCOUNTER — Encounter: Payer: Self-pay | Admitting: Neurology

## 2023-03-14 ENCOUNTER — Ambulatory Visit: Payer: Medicare HMO | Admitting: Neurology

## 2023-03-14 VITALS — BP 134/70 | HR 84 | Ht 75.0 in | Wt 171.0 lb

## 2023-03-14 DIAGNOSIS — M5410 Radiculopathy, site unspecified: Secondary | ICD-10-CM

## 2023-03-14 DIAGNOSIS — G3184 Mild cognitive impairment, so stated: Secondary | ICD-10-CM

## 2023-03-14 NOTE — Progress Notes (Signed)
Follow-up Visit   Date: 03/14/2023    Alex Nguyen MRN: [id number] DOB: 1945/10/31    Alex Nguyen is a 77 y.o. Caucasian male with hyperlipidemia returning to the clinic for follow-up of bilateral feet numbness/tingling.  The patient was accompanied to the clinic by self.   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  EMG shows prolonged late responses and right tibial temporal dispersion. Symptoms slowly improved without treatment.  He reports mild residual numbness in the feet.  Exam today shows normal sensation and reflexes, which is reassuring.  I recommend that we continue to monitor him.    Mild cognitive impairment.  He scored 22/30 on MOCA, missing points for visuospatial and delayed recall.  TSH and vitamin B12 is normal. - Check MRI brain wo contrast - Formal neuropsychological testing  Insomnia - Recommend follow-up with PCP  Return to clinic in 6 months  --------------------------------------------- History of present illness: In late January, he was diagnosed with UTI and started on ciproflxacin.  Around the first week of February, he began having numbness, tingling, and burning sensation involving the soles of the feet.  Symptoms gradually started making him feel unsteady, preventing him from running because of concern of falling.  Prior to his, he has been an avid runner.  Last week, he noticed mild weakness of the hands, reporting that he dropped a mirror.  He feels that when he forms a tight fist, his hands feet numb.     He lives alone in a one-level home.  Wife passed away 13 years ago. 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    UPDATE 11/16/2022:  He is here for follow-up visit. He reports that he has completed 4 sessions of PT and doing very well.  He is jogging 8 minutes and able to walk up to 30-min..  There has not been a significant change in burning, numbness of the toes.  No weakness or new  neurological symptoms.  He has a list of questions which were addressed.  UPDATE 03/14/2023:  He was running 3 miles in April three times per week.  In June, he stopped running due to right ear actinic keratosis and squamous cell cancer of the chest.  He resumed running this week and tolerated it well.  He continues to have numbness in the soles of the feet.  Burning has resolved.    Today, he is concerned about mild cognitive impairment.  He is forgetful with names, especially thinking of book authors or musicians.  He also has some word-finding difficulty.  He is able to keep up with IADLs and ADLs.  He reports being unable to start his car on one occasion and called [employer], who noticed that his car was in "drive", not "park. He does report having insomnia and poor sleep. He has tried doing his own cognitive behavior for insomnia.   Medications:  Current Outpatient Medications on File Prior to Visit  Medication Sig Dispense Refill   atorvastatin (LIPITOR) 10 MG tablet Take 10 mg by mouth daily.     Multiple Vitamin (DAILY MULTIVITAMIN PO) Take 1 tablet by mouth. Once daily      No current facility-administered medications on file prior to visit.    Allergies: No Known Allergies  Vital Signs:  BP 134/70   Pulse 84   Ht 6\' 3"  (1.905 m)   Wt 171 lb (77.6 kg)   SpO2 98%   BMI 21.37 kg/m    Neurological Exam: MENTAL STATUS  including orientation to time, place, person, recent and remote memory, attention span and concentration, language, and fund of knowledge is normal.  Speech is not dysarthric.    03/14/2023   11:00 AM  Montreal Cognitive Assessment   Visuospatial/ Executive (0/5) 2  Naming (0/3) 3  Attention: Read list of digits (0/2) 2  Attention: Read list of letters (0/1) 1  Attention: Serial 7 subtraction starting at 100 (0/3) 2  Language: Repeat phrase (0/2) 2  Language : Fluency (0/1) 1  Abstraction (0/2) 2  Delayed Recall (0/5) 1  Orientation (0/6) 6  Total 22  Adjusted  Score (based on education) 22    CRANIAL NERVES:   Normal conjugate, extra-ocular eye movements in all directions of gaze.  Mild left ptosis (old). Face is symmetric.  MOTOR:  Motor strength is 5/5 in all extremities, except including distally.  No atrophy, fasciculations or abnormal movements.  No pronator drift.  Tone is normal.    MSRs:  Reflexes are 2+/4 throughout, including ankles (improved).  SENSORY:  Intact to vibration throughout, including the great toe 100%.  COORDINATION/GAIT:   Intact rapid alternating movements bilaterally.  Gait narrow based and stable. Stressed and tandem gait intact.    Data: NCS/EMG of the legs 10/28/2022: The electrophysiologic findings are suggestive of a polyradiculoneuropathy given the presence of prolonged late responses and temporal dispersion of the right tibial nerve. Interestingly, sensory responses are normal. Correlate clinically   Labs 10/24/2022: ESR 5, CRP <1.0, TSH 1.11, folate >23, SPEP with IFE no M protein.  Copper 163  Total time spent reviewing records, interview, history/exam, documentation, and coordination of care on day of encounter:  40 min    Thank you for allowing me to participate in patient's care.  If I can answer any additional questions, I would be pleased to do so.    Sincerely,    Nyari Olsson K. Allena Katz, DO

## 2023-03-14 NOTE — Patient Instructions (Addendum)
[facility] will contact you to schedule MRI brain in the next 1-2 weeks  We will order neurocognitive evaluation (i.e., evaluation of memory and thinking abilities).  Please bring eyeglasses and hearing aids if you wear them and take any medications as you normally would. Please fully abstain from all alcohol, marijuana, or other substances prior to your appointment.   The evaluation will take approximately 2-3 hours and has two parts:   The first part is a clinical interview with the neuropsychologist, Dr. Milbert Coulter.  During the interview, the neuropsychologist will speak with you and the individual you brought to the appointment.    The second part of the evaluation is testing with the doctor's technician, aka psychometrician, Annabelle Harman or [employer]. During the testing, the technician will ask you to remember different types of material, solve problems, and answer some questionnaires. Your family member will not be present for this portion of the evaluation.   Please note: We have to reserve several hours of the neuropsychologist's time and the psychometrician's time for your evaluation appointment. As such, there is a No-Show fee of $100. If you are unable to attend any of your appointments, please contact our office as soon as possible to reschedule.

## 2023-03-21 ENCOUNTER — Other Ambulatory Visit: Payer: Medicare HMO

## 2023-03-23 ENCOUNTER — Encounter: Payer: Self-pay | Admitting: Neurology

## 2023-03-23 ENCOUNTER — Other Ambulatory Visit: Payer: Medicare HMO

## 2023-04-09 ENCOUNTER — Other Ambulatory Visit: Payer: Medicare HMO

## 2023-04-10 DIAGNOSIS — Z961 Presence of intraocular lens: Secondary | ICD-10-CM | POA: Diagnosis not present

## 2023-04-10 DIAGNOSIS — H40013 Open angle with borderline findings, low risk, bilateral: Secondary | ICD-10-CM | POA: Diagnosis not present

## 2023-04-10 DIAGNOSIS — H04123 Dry eye syndrome of bilateral lacrimal glands: Secondary | ICD-10-CM | POA: Diagnosis not present

## 2023-04-11 ENCOUNTER — Encounter: Payer: Self-pay | Admitting: Neurology

## 2023-04-14 ENCOUNTER — Other Ambulatory Visit: Payer: Medicare HMO

## 2023-04-18 ENCOUNTER — Encounter: Payer: Self-pay | Admitting: Neurology

## 2023-04-18 DIAGNOSIS — R972 Elevated prostate specific antigen [PSA]: Secondary | ICD-10-CM | POA: Diagnosis not present

## 2023-04-21 ENCOUNTER — Ambulatory Visit: Admission: RE | Admit: 2023-04-21 | Payer: Medicare HMO | Source: Ambulatory Visit

## 2023-04-21 DIAGNOSIS — G3184 Mild cognitive impairment, so stated: Secondary | ICD-10-CM

## 2023-05-10 DIAGNOSIS — N401 Enlarged prostate with lower urinary tract symptoms: Secondary | ICD-10-CM | POA: Diagnosis not present

## 2023-05-10 DIAGNOSIS — R3914 Feeling of incomplete bladder emptying: Secondary | ICD-10-CM | POA: Diagnosis not present

## 2023-05-26 ENCOUNTER — Telehealth: Payer: Self-pay | Admitting: Neurology

## 2023-05-26 NOTE — Telephone Encounter (Signed)
Pt called in stating his thighs have been tingly and when he is walking he doesn't feel his gait is quite right. He has felt it for about 2 weeks now. He is wondering if the neuropathy might have moved up to his thighs?

## 2023-05-29 NOTE — Telephone Encounter (Signed)
It would be difficult to determine if his neuropathy is moving into the thigh, as this is usually not common.  The only only way to determine this is by repeating his nerve testing.  If he would like to have this repeated, we can order EMG of the legs.

## 2023-05-29 NOTE — Telephone Encounter (Signed)
Called patient and informed him of Dr. Eliane Decree recommendations. Patient

## 2023-05-29 NOTE — Telephone Encounter (Signed)
Informed patient of Dr. Eliane Decree recommendations and patient stated he will think about it and give [place] a call if her would like to proceed with the EMG. Patient was advised if his symptoms worsen to seek care of an urgent care or Emergency department. Patient verbalized understanding and had no further questions or concerns.

## 2023-06-20 DIAGNOSIS — R972 Elevated prostate specific antigen [PSA]: Secondary | ICD-10-CM | POA: Diagnosis not present

## 2023-06-20 DIAGNOSIS — N401 Enlarged prostate with lower urinary tract symptoms: Secondary | ICD-10-CM | POA: Diagnosis not present

## 2023-06-20 DIAGNOSIS — R350 Frequency of micturition: Secondary | ICD-10-CM | POA: Diagnosis not present

## 2023-06-26 DIAGNOSIS — R3914 Feeling of incomplete bladder emptying: Secondary | ICD-10-CM | POA: Diagnosis not present

## 2023-06-26 DIAGNOSIS — R972 Elevated prostate specific antigen [PSA]: Secondary | ICD-10-CM | POA: Diagnosis not present

## 2023-07-05 DIAGNOSIS — I700 Atherosclerosis of aorta: Secondary | ICD-10-CM | POA: Diagnosis not present

## 2023-07-05 DIAGNOSIS — R972 Elevated prostate specific antigen [PSA]: Secondary | ICD-10-CM | POA: Diagnosis not present

## 2023-07-05 DIAGNOSIS — R030 Elevated blood-pressure reading, without diagnosis of hypertension: Secondary | ICD-10-CM | POA: Diagnosis not present

## 2023-07-05 DIAGNOSIS — G3184 Mild cognitive impairment, so stated: Secondary | ICD-10-CM | POA: Diagnosis not present

## 2023-07-05 DIAGNOSIS — G629 Polyneuropathy, unspecified: Secondary | ICD-10-CM | POA: Diagnosis not present

## 2023-07-05 DIAGNOSIS — N401 Enlarged prostate with lower urinary tract symptoms: Secondary | ICD-10-CM | POA: Diagnosis not present

## 2023-07-06 DIAGNOSIS — N401 Enlarged prostate with lower urinary tract symptoms: Secondary | ICD-10-CM | POA: Diagnosis not present

## 2023-07-24 DIAGNOSIS — H903 Sensorineural hearing loss, bilateral: Secondary | ICD-10-CM | POA: Diagnosis not present

## 2023-07-24 DIAGNOSIS — H6123 Impacted cerumen, bilateral: Secondary | ICD-10-CM | POA: Diagnosis not present

## 2023-08-03 ENCOUNTER — Emergency Department (HOSPITAL_COMMUNITY): Payer: Medicare HMO

## 2023-08-03 ENCOUNTER — Emergency Department (HOSPITAL_COMMUNITY)
Admission: EM | Admit: 2023-08-03 | Discharge: 2023-08-04 | Disposition: A | Discharge status: Home / Self Care | Payer: Medicare HMO | Attending: Emergency Medicine | Admitting: Emergency Medicine

## 2023-08-03 ENCOUNTER — Encounter (HOSPITAL_COMMUNITY): Payer: Self-pay

## 2023-08-03 DIAGNOSIS — S60512A Abrasion of left hand, initial encounter: Secondary | ICD-10-CM | POA: Diagnosis not present

## 2023-08-03 DIAGNOSIS — E785 Hyperlipidemia, unspecified: Secondary | ICD-10-CM | POA: Insufficient documentation

## 2023-08-03 DIAGNOSIS — S0081XA Abrasion of other part of head, initial encounter: Secondary | ICD-10-CM | POA: Diagnosis not present

## 2023-08-03 DIAGNOSIS — S60511A Abrasion of right hand, initial encounter: Secondary | ICD-10-CM | POA: Insufficient documentation

## 2023-08-03 DIAGNOSIS — Z23 Encounter for immunization: Secondary | ICD-10-CM | POA: Insufficient documentation

## 2023-08-03 DIAGNOSIS — R9082 White matter disease, unspecified: Secondary | ICD-10-CM | POA: Diagnosis not present

## 2023-08-03 DIAGNOSIS — W01198A Fall on same level from slipping, tripping and stumbling with subsequent striking against other object, initial encounter: Secondary | ICD-10-CM | POA: Insufficient documentation

## 2023-08-03 DIAGNOSIS — W19XXXA Unspecified fall, initial encounter: Secondary | ICD-10-CM

## 2023-08-03 DIAGNOSIS — M79642 Pain in left hand: Secondary | ICD-10-CM | POA: Diagnosis not present

## 2023-08-03 DIAGNOSIS — Z471 Aftercare following joint replacement surgery: Secondary | ICD-10-CM | POA: Diagnosis not present

## 2023-08-03 DIAGNOSIS — S0993XA Unspecified injury of face, initial encounter: Secondary | ICD-10-CM | POA: Diagnosis not present

## 2023-08-03 MED ORDER — TETANUS-DIPHTH-ACELL PERTUSSIS 5-2.5-18.5 LF-MCG/0.5 IM SUSY
0.5000 mL | PREFILLED_SYRINGE | Freq: Once | INTRAMUSCULAR | Status: AC
  Administered 2023-08-03: 0.5 mL via INTRAMUSCULAR
  Filled 2023-08-03: qty 0.5

## 2023-08-03 NOTE — ED Provider Notes (Signed)
[name] Provider Note   CSN: [id number] Arrival date & time: 08/03/23  1112     History  Chief Complaint  Patient presents with   Alex Nguyen    Alex CLEMMER is a 77 y.o. male.  77 year old male with a past medical history of HLD presents here after a fall that occurred while he was jogging this morning.  Patient states that he tripped over an uneven piece of concrete.  He fell forward, caught himself with his bilateral hands, and struck his face on the pavement.  He denies LOC.  He was able to walk home after this.  He is not on anticoagulation.  Patient endorses abrasions on his forehead and bilateral palms with pain in those regions.  The history is provided by the patient.       Home Medications Prior to Admission medications   Medication Sig Start Date End Date Taking? Authorizing Provider  atorvastatin (LIPITOR) 10 MG tablet Take 10 mg by mouth daily.    [provider]  Multiple Vitamin (DAILY MULTIVITAMIN PO) Take 1 tablet by mouth. Once daily     [provider]      Allergies    Patient has no known allergies.    Review of Systems   As noted in HPI  Physical Exam Updated Vital Signs BP (!) 168/74 (BP Location: Right Arm)   Pulse 71   Temp 98.5 F (36.9 C) (Oral)   Resp 18   SpO2 100%  Physical Exam Vitals reviewed.  Constitutional:      General: He is not in acute distress.    Appearance: Normal appearance. He is normal weight. He is not ill-appearing, toxic-appearing or diaphoretic.  HENT:     Head:     Comments: Superficial abrasion to the left forehead    Nose: Nose normal.     Mouth/Throat:     Mouth: Mucous membranes are moist.     Pharynx: Oropharynx is clear. No oropharyngeal exudate or posterior oropharyngeal erythema.  Eyes:     Extraocular Movements: Extraocular movements intact.     Pupils: Pupils are equal, round, and reactive to light.  Cardiovascular:     Rate and Rhythm:  Normal rate and regular rhythm.     Heart sounds: Normal heart sounds. No murmur heard.    No friction rub. No gallop.  Pulmonary:     Effort: Pulmonary effort is normal. No respiratory distress.     Breath sounds: Normal breath sounds. No wheezing, rhonchi or rales.  Musculoskeletal:     Right wrist: Normal. No deformity, tenderness or snuff box tenderness.     Left wrist: Normal. No deformity, tenderness or snuff box tenderness.     Right hand: Tenderness (Over the thenar eminence) present.     Left hand: No deformity or tenderness.     Cervical back: No bony tenderness.     Thoracic back: No bony tenderness.     Lumbar back: No bony tenderness.  Skin:    General: Skin is warm and dry.     Findings: Abrasion (Superficial abrasions to bilateral palms.) present.  Neurological:     Mental Status: He is alert.     GCS: GCS eye subscore is 4. GCS verbal subscore is 5. GCS motor subscore is 6.     Cranial Nerves: Cranial nerves 2-12 are intact. No cranial nerve deficit, dysarthria or facial asymmetry.     Sensory: Sensation is intact. No sensory deficit.  Motor: Motor function is intact. No weakness.     ED Results / Procedures / Treatments   Labs (all labs ordered are listed, but only abnormal results are displayed) Labs Reviewed - No data to display  EKG None  Radiology DG Hand Complete Left  Result Date: 08/03/2023 CLINICAL DATA:  Fall, pain. EXAM: LEFT HAND - COMPLETE 3 VIEW COMPARISON:  None Available. FINDINGS: There is no evidence of fracture or dislocation. There is no evidence of arthropathy or other focal bone abnormality. Soft tissues are unremarkable. IMPRESSION: Negative. Electronically Signed   By: Layla Maw M.D.   On: 08/03/2023 23:23   CT Head Wo Contrast  Result Date: 08/03/2023 CLINICAL DATA:  Fall, facial trauma EXAM: CT HEAD WITHOUT CONTRAST TECHNIQUE: Contiguous axial images were obtained from the base of the skull through the vertex without  intravenous contrast. RADIATION DOSE REDUCTION: This exam was performed according to the departmental dose-optimization program which includes automated exposure control, adjustment of the mA and/or kV according to patient size and/or use of iterative reconstruction technique. COMPARISON:  No prior CT head available, correlation is made with 04/21/2023 MRI head FINDINGS: Brain: No evidence of acute infarction, hemorrhage, mass, mass effect, or midline shift. No hydrocephalus or extra-axial fluid collection. 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 Vascular: No hyperdense vessel. Skull: Negative for fracture or focal lesion. Right paramedian forehead laceration. Sinuses/Orbits: Mild mucosal thickening in the left maxillary sinus. Status post bilateral lens replacements. Other: The mastoid air cells are well aerated. IMPRESSION: No acute intracranial process. Electronically Signed   By: Wiliam Ke M.D.   On: 08/03/2023 14:25    Procedures Procedures    Medications Ordered in ED Medications  Tdap (BOOSTRIX) injection 0.5 mL (0.5 mLs Intramuscular Given 08/03/23 2337)    ED Course/ Medical Decision Making/ A&P                                 Medical Decision Making Amount and/or Complexity of Data Reviewed Radiology: ordered and independent interpretation performed.  Risk Prescription drug management.   77 year old male presents here after mechanical fall that occurred earlier today.  Vitals reassuring. On exam, patient is noted to have superficial abrasions as described above.  He also has tenderness to palpation of the right thenar eminence.  No tenderness palpation of bilateral upper extremities.  No midline C-spine, T-spine, L-spine tenderness.  No neurologic deficits.  Initial differential diagnosis includes traumatic intracranial hemorrhage, skull fracture, acute fracture, soft tissue injury.  Will evaluate for traumatic injuries  with CT head without contrast and x-ray of right hand.  Patient offered analgesia, with Tylenol.  He declined.  I independently reviewed this patient's imaging, which does not demonstrate evidence of traumatic intracranial hemorrhage, skull fracture, or acute fractures of the hand.    Abrasions to the hands were irrigated with sterile saline. Tdap was updated here.  Workup reassuring.  Patient does not meet criteria for hospital admission.  He is felt to be appropriate for discharge at this time.  Discharged in stable condition.  Patient's presentation is most consistent with acute complicated illness / injury requiring diagnostic workup.         Final Clinical Impression(s) / ED Diagnoses Final diagnoses:  Fall, initial encounter    Rx / DC Orders ED Discharge Orders     None         Rolla Flatten, MD  08/04/23 0017    Rozelle Logan, DO 08/11/23 [phone]

## 2023-08-03 NOTE — ED Triage Notes (Signed)
Pt was jogging today and got tripped up on uneven pavement. He fell forward and braced the impact with his hands first. There is small abrasions to the palms of his hands but no obvious deformities and can move all digits well. He illicit's no pain in his hands, arms, shoulders, or lower extremities. More significantly he has an abrasion/laceration to the center of his forehead that has a band aid on it. He is no on blood thinners and is otherwise stable.

## 2023-08-03 NOTE — ED Notes (Signed)
Patient transported to X-ray 

## 2023-08-03 NOTE — Discharge Instructions (Signed)
Please return to the emergency department if you develop headaches that are abnormal for you, confusion, changes in your vision.

## 2023-08-04 NOTE — ED Notes (Addendum)
Discharge instructions reviewed.   Opportunity for questions and concerns provided.   Alert, oriented and ambulatory.   Supplies for wound care provided.   Pt declined discharge vital signs saying he has equipment to check at home.   Escorted to personal vehicle via wheelchair. Displays no signs of distress.

## 2023-08-07 ENCOUNTER — Institutional Professional Consult (permissible substitution): Payer: Medicare HMO | Admitting: Psychology

## 2023-08-07 ENCOUNTER — Ambulatory Visit: Payer: Self-pay

## 2023-08-14 ENCOUNTER — Encounter: Payer: Medicare HMO | Admitting: Psychology

## 2023-08-29 ENCOUNTER — Encounter: Payer: Self-pay | Admitting: Neurology

## 2023-08-31 DIAGNOSIS — Z85828 Personal history of other malignant neoplasm of skin: Secondary | ICD-10-CM | POA: Diagnosis not present

## 2023-08-31 DIAGNOSIS — Z08 Encounter for follow-up examination after completed treatment for malignant neoplasm: Secondary | ICD-10-CM | POA: Diagnosis not present

## 2023-08-31 DIAGNOSIS — D1801 Hemangioma of skin and subcutaneous tissue: Secondary | ICD-10-CM | POA: Diagnosis not present

## 2023-08-31 DIAGNOSIS — L728 Other follicular cysts of the skin and subcutaneous tissue: Secondary | ICD-10-CM | POA: Diagnosis not present

## 2023-08-31 DIAGNOSIS — L570 Actinic keratosis: Secondary | ICD-10-CM | POA: Diagnosis not present

## 2023-08-31 DIAGNOSIS — C44629 Squamous cell carcinoma of skin of left upper limb, including shoulder: Secondary | ICD-10-CM | POA: Diagnosis not present

## 2023-08-31 DIAGNOSIS — Z85820 Personal history of malignant melanoma of skin: Secondary | ICD-10-CM | POA: Diagnosis not present

## 2023-08-31 DIAGNOSIS — R229 Localized swelling, mass and lump, unspecified: Secondary | ICD-10-CM | POA: Diagnosis not present

## 2023-09-18 ENCOUNTER — Ambulatory Visit: Payer: Medicare HMO | Admitting: Neurology

## 2023-09-26 ENCOUNTER — Ambulatory Visit: Payer: Medicare HMO | Admitting: Neurology

## 2023-09-26 ENCOUNTER — Encounter: Payer: Self-pay | Admitting: Neurology

## 2023-09-26 VITALS — BP 145/90 | HR 80 | Ht 75.0 in | Wt 175.0 lb

## 2023-09-26 DIAGNOSIS — G629 Polyneuropathy, unspecified: Secondary | ICD-10-CM

## 2023-09-26 DIAGNOSIS — G3184 Mild cognitive impairment, so stated: Secondary | ICD-10-CM | POA: Diagnosis not present

## 2023-09-26 NOTE — Progress Notes (Signed)
Follow-up Visit   Date: 09/26/2023    Alex Nguyen MRN: [id number] DOB: Dec 15, 1945    Alex Nguyen is a 78 y.o. Caucasian male with hyperlipidemia returning to the clinic for follow-up of bilateral feet numbness/tingling.  The patient was accompanied to the clinic by self.   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  EMG shows prolonged late responses and right tibial temporal dispersion, sensory responses were intact. Symptoms slowly improved without treatment.  He reports mild residual numbness in the feet.  - Continue to monitor    Mild cognitive impairment.  TSH and vitamin B12 is normal.  MRI brain shows mild age-related changes, no atrophy.  Formal neuropsychological testing deferred.     Return to clinic in 9 months  --------------------------------------------- History of present illness: In late January, he was diagnosed with UTI and started on ciproflxacin.  Around the first week of February, he began having numbness, tingling, and burning sensation involving the soles of the feet.  Symptoms gradually started making him feel unsteady, preventing him from running because of concern of falling.  Prior to his, he has been an avid runner.  Last week, he noticed mild weakness of the hands, reporting that he dropped a mirror.  He feels that when he forms a tight fist, his hands feet numb.     He lives alone in a one-level home.  Wife passed away 13 years ago. 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    UPDATE 11/16/2022:  He is here for follow-up visit. He reports that he has completed 4 sessions of PT and doing very well.  He is jogging 8 minutes and able to walk up to 30-min..  There has not been a significant change in burning, numbness of the toes.  No weakness or new neurological symptoms.  He has a list of questions which were addressed.  UPDATE 03/14/2023:  He was running 3 miles in April three times per week.  In  June, he stopped running due to right ear actinic keratosis and squamous cell cancer of the chest.  He resumed running this week and tolerated it well.  He continues to have numbness in the soles of the feet.  Burning has resolved.    Today, he is concerned about mild cognitive impairment.  He is forgetful with names, especially thinking of book authors or musicians.  He also has some word-finding difficulty.  He is able to keep up with IADLs and ADLs.  He reports being unable to start his car on one occasion and called [employer], who noticed that his car was in "drive", not "park. He does report having insomnia and poor sleep. He has tried doing his own cognitive behavior for insomnia.   UPDATE 09/26/2023:  He is here for follow-up visit.  He decided not to pursue neuropsychological testing. Word-finding difficulty is stable.   He continues to have numbness involving the front of the thighs.  He denies low back pain and weakness.   He was running until he had a mechanical fall in December.  The weather has prevented him from getting back into his regular routine.     Medications:  Current Outpatient Medications on File Prior to Visit  Medication Sig Dispense Refill   atorvastatin (LIPITOR) 10 MG tablet Take 10 mg by mouth daily.     Multiple Vitamin (DAILY MULTIVITAMIN PO) Take 1 tablet by mouth. Once daily      tamsulosin (FLOMAX) 0.4 MG CAPS capsule Take 0.4  mg by mouth daily.     No current facility-administered medications on file prior to visit.    Allergies: No Known Allergies  Vital Signs:  BP (!) 145/90   Pulse 80   Ht 6\' 3"  (1.905 m)   Wt 175 lb (79.4 kg)   SpO2 99%   BMI 21.87 kg/m    Neurological Exam: MENTAL STATUS including orientation to time, place, person, recent and remote memory, attention span and concentration, language, and fund of knowledge is normal.  Speech is not dysarthric.    03/14/2023   11:00 AM  Montreal Cognitive Assessment   Visuospatial/ Executive (0/5) 2   Naming (0/3) 3  Attention: Read list of digits (0/2) 2  Attention: Read list of letters (0/1) 1  Attention: Serial 7 subtraction starting at 100 (0/3) 2  Language: Repeat phrase (0/2) 2  Language : Fluency (0/1) 1  Abstraction (0/2) 2  Delayed Recall (0/5) 1  Orientation (0/6) 6  Total 22  Adjusted Score (based on education) 22    CRANIAL NERVES:   Normal conjugate, extra-ocular eye movements in all directions of gaze.  Mild left ptosis (old). Face is symmetric.  MOTOR:  Motor strength is 5/5 in all extremities, except including distally.  No atrophy, fasciculations or abnormal movements.  No pronator drift.  Tone is normal.    MSRs:  Reflexes are 2+/4 throughout, including ankles.  SENSORY:  Intact to vibration throughout, right great toe is intact, left great toe is mildly reduced.   COORDINATION/GAIT:   Intact rapid alternating movements bilaterally.  Gait narrow based and stable. Stressed and tandem gait intact.    Data: NCS/EMG of the legs 10/28/2022: The electrophysiologic findings are suggestive of a polyradiculoneuropathy given the presence of prolonged late responses and temporal dispersion of the right tibial nerve. Interestingly, sensory responses are normal. Correlate clinically   Labs 10/24/2022: ESR 5, CRP <1.0, TSH 1.11, folate >23, SPEP with IFE no M protein.  Copper 163  MRI brain wo contrast 04/22/2023: 1. No evidence of an acute intracranial abnormality. 2. No age advanced or lobar predominant parenchymal atrophy. 3. Mild chronic small vessel ischemic changes within the cerebral white matter. 4. Mild paranasal sinus mucosal thickening.  Lab Results  Component Value Date   TSH 1.11 10/24/2022   Total time spent reviewing records, interview, history/exam, documentation, and coordination of care on day of encounter:  20 min    Thank you for allowing me to participate in patient's care.  If I can answer any additional questions, I would be pleased to do so.     Sincerely,    Lateisha Thurlow K. Allena Katz, DO

## 2023-10-30 DIAGNOSIS — H59811 Chorioretinal scars after surgery for detachment, right eye: Secondary | ICD-10-CM | POA: Diagnosis not present

## 2023-10-30 DIAGNOSIS — H04123 Dry eye syndrome of bilateral lacrimal glands: Secondary | ICD-10-CM | POA: Diagnosis not present

## 2023-10-30 DIAGNOSIS — H40013 Open angle with borderline findings, low risk, bilateral: Secondary | ICD-10-CM | POA: Diagnosis not present

## 2023-10-30 DIAGNOSIS — Z961 Presence of intraocular lens: Secondary | ICD-10-CM | POA: Diagnosis not present

## 2023-11-02 ENCOUNTER — Encounter (HOSPITAL_COMMUNITY): Payer: Self-pay

## 2023-11-02 ENCOUNTER — Ambulatory Visit (HOSPITAL_COMMUNITY)
Admission: RE | Admit: 2023-11-02 | Discharge: 2023-11-02 | Disposition: A | Discharge status: Home / Self Care | Payer: Self-pay | Source: Ambulatory Visit | Attending: Internal Medicine | Admitting: Internal Medicine

## 2023-11-02 VITALS — BP 133/70 | HR 69 | Temp 97.9°F | Resp 16 | Ht 75.0 in | Wt 170.0 lb

## 2023-11-02 DIAGNOSIS — Z5189 Encounter for other specified aftercare: Secondary | ICD-10-CM

## 2023-11-02 NOTE — ED Provider Notes (Signed)
 [facility]    CSN: [id number] Arrival date & time: 11/02/23  7829      History   Chief Complaint Chief Complaint  Patient presents with   Wound Check    On August 03, 2023 I fell while running. My forehead hit pavement, and there was significant bleeding. I went to [facility] but wasn't given stitches. Unfortunately I still get bleeding from my forehead. I would like stitches to end bleeding. - Entered by patient    HPI Alex Nguyen is a 78 y.o. male.   Patient presents for wound check to forehead.  Patient states 3 months ago he fell and hit his forehead on the pavement and went to the ED where they did not place stitches, staples, or Steri-Strips.  Patient states that the wound occasionally reopens and bleeds and then scabs back over.  Wound is not bleeding at this time.  Denies swelling, redness, or purulent drainage.   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   ABNORMALITY OF GAIT 06/28/2010    Past Surgical History:  Procedure Laterality Date   CATARACT EXTRACTION W/ INTRAOCULAR LENS  IMPLANT, BILATERAL     CYSTOSCOPY WITH RETROGRADE PYELOGRAM, URETEROSCOPY AND STENT PLACEMENT Left 08/07/2013   Procedure: CYSTOSCOPY WITH RETROGRADE PYELOGRAM, URETEROSCOPY AND  STENT PLACEMENT;  Surgeon: Milford Cage, MD;  Location: [facility];  Service: Urology;  Laterality: Left;   HOLMIUM LASER APPLICATION Left 08/07/2013   Procedure: HOLMIUM LASER APPLICATION;  Surgeon: Milford Cage, MD;  Location: [facility];  Service: Urology;  Laterality: Left;   KNEE ARTHROSCOPY W/ MENISCAL REPAIR Right 1960'S   LESION REMOVAL Left  12/17/2019   Procedure: EXCISION OF BUTTOCK SEBACEOUS CYST;  Surgeon: Gaynelle Adu, MD;  Location: [facility];  Service: General;  Laterality: Left;   RETINAL DETACHMENT SURGERY Right 2011       Home Medications    Prior to Admission medications   Medication Sig Start Date End Date Taking? Authorizing Provider  atorvastatin (LIPITOR) 10 MG tablet Take 10 mg by mouth daily.    [provider]  Multiple Vitamin (DAILY MULTIVITAMIN PO) Take 1 tablet by mouth. Once daily     [provider]  tamsulosin (FLOMAX) 0.4 MG CAPS capsule Take 0.4 mg by mouth daily.    [provider]    Family History Family History  Problem Relation Age of Onset   Stroke Mother     Social History Social History   Tobacco Use   Smoking status: Never   Smokeless tobacco: Never  Substance Use Topics   Alcohol use: Yes    Comment: RARE   Drug use: No     Allergies   Patient has no known allergies.   Review of Systems Review of Systems  Per HPI  Physical Exam Triage Vital Signs ED Triage Vitals [11/02/23 0933]  Encounter Vitals Group     BP 133/70     Systolic BP Percentile      Diastolic BP Percentile      Pulse Rate 69     Resp 16     Temp 97.9 F (36.6  C)     Temp Source Oral     SpO2 97 %     Weight 170 lb (77.1 kg)     Height 6\' 3"  (1.905 m)     Head Circumference      Peak Flow      Pain Score 0     Pain Loc      Pain Education      Exclude from Growth Chart    No data found.  Updated Vital Signs BP 133/70 (BP Location: Right Arm)   Pulse 69   Temp 97.9 F (36.6 C) (Oral)   Resp 16   Ht 6\' 3"  (1.905 m)   Wt 170 lb (77.1 kg)   SpO2 97%   BMI 21.25 kg/m   Visual Acuity Right Eye Distance:   Left Eye Distance:   Bilateral Distance:    Right Eye Near:   Left Eye Near:    Bilateral Near:     Physical Exam Vitals and nursing note reviewed.  Constitutional:      General: He is awake. He is not in acute distress.     Appearance: Normal appearance. He is well-developed and well-groomed. He is not ill-appearing.  Skin:    Findings: Wound present.     Comments: 2 cm scar noted to mid forehead with pinpoint-sized scab. Wound appears to be approximated and healed well.   Neurological:     Mental Status: He is alert.  Psychiatric:        Behavior: Behavior is cooperative.      UC Treatments / Results  Labs (all labs ordered are listed, but only abnormal results are displayed) Labs Reviewed - No data to display  EKG   Radiology No results found.  Procedures Procedures (including critical care time)  Medications Ordered in UC Medications - No data to display  Initial Impression / Assessment and Plan / UC Course  I have reviewed the triage vital signs and the nursing notes.  Pertinent labs & imaging results that were available during my care of the patient were reviewed by me and considered in my medical decision making (see chart for details).     Upon assessment there is a 2 cm scar noted to mid forehead with a pinpoint sized scab.  Wound appears to be approximated and healed well at this time.  No interventions needed at this time.  Discussed follow-up and return precautions. Final Clinical Impressions(s) / UC Diagnoses   Final diagnoses:  Visit for wound check     Discharge Instructions      As discussed there is 1 very small area with a scab that could potentially reopen and cause a small amount of bleeding, but otherwise the wound appears to be healing well.  If issues with wound persist follow-up with your primary care provider or return here as needed.    ED Prescriptions   None    PDMP not reviewed this encounter.   Wynonia Lawman A, NP 11/02/23 1009

## 2023-11-02 NOTE — Discharge Instructions (Signed)
 As discussed there is 1 very small area with a scab that could potentially reopen and cause a small amount of bleeding, but otherwise the wound appears to be healing well.  If issues with wound persist follow-up with your primary care provider or return here as needed.

## 2023-11-02 NOTE — ED Triage Notes (Signed)
 Patient here today for a wound check. Patient had a fall 3 months ago and hit his forehead on the pavement. Patient had went to the ED but no stitches were placed and patient states that he has noticed that it still bleeds occasionally.

## 2023-11-14 DIAGNOSIS — L989 Disorder of the skin and subcutaneous tissue, unspecified: Secondary | ICD-10-CM | POA: Diagnosis not present

## 2023-11-14 DIAGNOSIS — I10 Essential (primary) hypertension: Secondary | ICD-10-CM | POA: Diagnosis not present

## 2023-12-13 DIAGNOSIS — N401 Enlarged prostate with lower urinary tract symptoms: Secondary | ICD-10-CM | POA: Diagnosis not present

## 2023-12-13 DIAGNOSIS — R3914 Feeling of incomplete bladder emptying: Secondary | ICD-10-CM | POA: Diagnosis not present

## 2024-02-19 DIAGNOSIS — R972 Elevated prostate specific antigen [PSA]: Secondary | ICD-10-CM | POA: Diagnosis not present

## 2024-02-19 DIAGNOSIS — G3184 Mild cognitive impairment, so stated: Secondary | ICD-10-CM | POA: Diagnosis not present

## 2024-02-19 DIAGNOSIS — Z85820 Personal history of malignant melanoma of skin: Secondary | ICD-10-CM | POA: Diagnosis not present

## 2024-02-19 DIAGNOSIS — Z1331 Encounter for screening for depression: Secondary | ICD-10-CM | POA: Diagnosis not present

## 2024-02-19 DIAGNOSIS — E7800 Pure hypercholesterolemia, unspecified: Secondary | ICD-10-CM | POA: Diagnosis not present

## 2024-02-19 DIAGNOSIS — N401 Enlarged prostate with lower urinary tract symptoms: Secondary | ICD-10-CM | POA: Diagnosis not present

## 2024-02-19 DIAGNOSIS — I10 Essential (primary) hypertension: Secondary | ICD-10-CM | POA: Diagnosis not present

## 2024-02-19 DIAGNOSIS — I700 Atherosclerosis of aorta: Secondary | ICD-10-CM | POA: Diagnosis not present

## 2024-02-19 DIAGNOSIS — G629 Polyneuropathy, unspecified: Secondary | ICD-10-CM | POA: Diagnosis not present

## 2024-02-19 DIAGNOSIS — Z23 Encounter for immunization: Secondary | ICD-10-CM | POA: Diagnosis not present

## 2024-02-19 DIAGNOSIS — Z0000 Encounter for general adult medical examination without abnormal findings: Secondary | ICD-10-CM | POA: Diagnosis not present

## 2024-02-26 DIAGNOSIS — N401 Enlarged prostate with lower urinary tract symptoms: Secondary | ICD-10-CM | POA: Diagnosis not present

## 2024-02-26 DIAGNOSIS — I10 Essential (primary) hypertension: Secondary | ICD-10-CM | POA: Diagnosis not present

## 2024-02-26 DIAGNOSIS — E7800 Pure hypercholesterolemia, unspecified: Secondary | ICD-10-CM | POA: Diagnosis not present

## 2024-03-28 DIAGNOSIS — I10 Essential (primary) hypertension: Secondary | ICD-10-CM | POA: Diagnosis not present

## 2024-03-28 DIAGNOSIS — N401 Enlarged prostate with lower urinary tract symptoms: Secondary | ICD-10-CM | POA: Diagnosis not present

## 2024-03-28 DIAGNOSIS — E7800 Pure hypercholesterolemia, unspecified: Secondary | ICD-10-CM | POA: Diagnosis not present

## 2024-04-11 DIAGNOSIS — Z08 Encounter for follow-up examination after completed treatment for malignant neoplasm: Secondary | ICD-10-CM | POA: Diagnosis not present

## 2024-04-11 DIAGNOSIS — Z85828 Personal history of other malignant neoplasm of skin: Secondary | ICD-10-CM | POA: Diagnosis not present

## 2024-04-11 DIAGNOSIS — L814 Other melanin hyperpigmentation: Secondary | ICD-10-CM | POA: Diagnosis not present

## 2024-04-11 DIAGNOSIS — L2089 Other atopic dermatitis: Secondary | ICD-10-CM | POA: Diagnosis not present

## 2024-04-11 DIAGNOSIS — R229 Localized swelling, mass and lump, unspecified: Secondary | ICD-10-CM | POA: Diagnosis not present

## 2024-04-11 DIAGNOSIS — D225 Melanocytic nevi of trunk: Secondary | ICD-10-CM | POA: Diagnosis not present

## 2024-04-11 DIAGNOSIS — C44629 Squamous cell carcinoma of skin of left upper limb, including shoulder: Secondary | ICD-10-CM | POA: Diagnosis not present

## 2024-04-11 DIAGNOSIS — Z85820 Personal history of malignant melanoma of skin: Secondary | ICD-10-CM | POA: Diagnosis not present

## 2024-04-11 DIAGNOSIS — L821 Other seborrheic keratosis: Secondary | ICD-10-CM | POA: Diagnosis not present

## 2024-05-27 DIAGNOSIS — H04123 Dry eye syndrome of bilateral lacrimal glands: Secondary | ICD-10-CM | POA: Diagnosis not present

## 2024-05-27 DIAGNOSIS — H40013 Open angle with borderline findings, low risk, bilateral: Secondary | ICD-10-CM | POA: Diagnosis not present

## 2024-05-27 DIAGNOSIS — H31091 Other chorioretinal scars, right eye: Secondary | ICD-10-CM | POA: Diagnosis not present

## 2024-06-26 ENCOUNTER — Encounter: Payer: Self-pay | Admitting: Neurology

## 2024-06-26 ENCOUNTER — Other Ambulatory Visit

## 2024-06-26 ENCOUNTER — Ambulatory Visit: Payer: Medicare HMO | Admitting: Neurology

## 2024-06-26 VITALS — BP 128/71 | HR 86 | Ht 75.0 in | Wt 165.0 lb

## 2024-06-26 DIAGNOSIS — G3184 Mild cognitive impairment, so stated: Secondary | ICD-10-CM

## 2024-06-26 DIAGNOSIS — G629 Polyneuropathy, unspecified: Secondary | ICD-10-CM | POA: Diagnosis not present

## 2024-06-26 NOTE — Progress Notes (Signed)
 Follow-up Visit   Date: 06/26/2024    Alex Nguyen MRN: [id number] DOB: Sep 11, 1945    Alex Nguyen is a 78 y.o. Caucasian male with hyperlipidemia returning to the clinic for follow-up of bilateral feet numbness/tingling.  The patient was accompanied to the clinic by self.   IMPRESSION/PLAN: Mild cognitive impairment with memory changes.  He is able to complete ADLs and IADLs.  MRI brain shows mild age-related changes, no atrophy.  MOCA 20/30 missing points for delayed recall, attention, and language.    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  EMG in 2024 shows prolonged late responses and right tibial temporal dispersion, sensory responses were intact. Symptoms slowly improved without treatment.  He reports mild residual numbness in the feet, worse on the left.  - Continue to monitor    Return to clinic in 6 months  --------------------------------------------- History of present illness: In late January, he was diagnosed with UTI and started on ciproflxacin.  Around the first week of February, he began having numbness, tingling, and burning sensation involving the soles of the feet.  Symptoms gradually started making him feel unsteady, preventing him from running because of concern of falling.  Prior to his, he has been an avid runner.  Last week, he noticed mild weakness of the hands, reporting that he dropped a mirror.  He feels that when he forms a tight fist, his hands feet numb.     He lives alone in a one-level home.  Wife passed away 13 years ago. 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    UPDATE 11/16/2022:  He is here for follow-up visit. He reports that he has completed 4 sessions of PT and doing very well.  He is jogging 8 minutes and able to walk up to 30-min..  There has not been a significant change in burning, numbness of the toes.   No weakness or new neurological symptoms.  He has a list of questions which were addressed.  UPDATE 03/14/2023:  He was running 3 miles in April three times per week.  In June, he stopped running due to right ear actinic keratosis and squamous cell cancer of the chest.  He resumed running this week and tolerated it well.  He continues to have numbness in the soles of the feet.  Burning has resolved.    Today, he is concerned about mild cognitive impairment.  He is forgetful with names, especially thinking of book authors or musicians.  He also has some word-finding difficulty.  He is able to keep up with IADLs and ADLs.  He reports being unable to start his car on one occasion and called [employer], who noticed that his car was in drive, not park. He does report having insomnia and poor sleep. He has tried doing his own cognitive behavior for insomnia.   UPDATE 09/26/2023:  He is here for follow-up visit.  He decided not to pursue neuropsychological testing. Word-finding difficulty is stable.   He continues to have numbness involving the front of the thighs.  He denies low back pain and weakness.   He was running until he had a mechanical fall in December.  The weather has prevented him from getting back into his regular routine.    UPDATE 06/26/2024:  He is here for follow-up visit.  Overall, symptoms are stable.  He reports that his memory is slightly worse and continues to have  word-finding difficulty.  He has mild unsteadiness in the legs and numbness in the thigh, which is unchanged.  He continues to run 3 miles several times per week.  He is highly independent and denies problems with driving, managing finances, or medications.    Medications:  Current Outpatient Medications on File Prior to Visit  Medication Sig Dispense Refill   atorvastatin (LIPITOR) 10 MG tablet Take 10 mg by mouth daily.     Multiple Vitamin (DAILY MULTIVITAMIN PO) Take 1 tablet by mouth. Once daily      tamsulosin  (FLOMAX ) 0.4  MG CAPS capsule Take 0.4 mg by mouth daily.     No current facility-administered medications on file prior to visit.    Allergies: No Known Allergies  Vital Signs:  BP 128/71   Pulse 86   Ht 6' 3 (1.905 m)   Wt 165 lb (74.8 kg)   SpO2 100%   BMI 20.62 kg/m    Neurological Exam: MENTAL STATUS including orientation to time, place, person, recent and remote memory, attention span and concentration, language, and fund of knowledge is normal.  Speech is not dysarthric.    06/26/2024    9:36 AM 03/14/2023   11:00 AM  Montreal Cognitive Assessment   Visuospatial/ Executive (0/5) 4 2  Naming (0/3) 3 3  Attention: Read list of digits (0/2) 2 2  Attention: Read list of letters (0/1) 1 1  Attention: Serial 7 subtraction starting at 100 (0/3) 1 2  Language: Repeat phrase (0/2) 1 2  Language : Fluency (0/1) 0 1  Abstraction (0/2) 2 2  Delayed Recall (0/5) 1 1  Orientation (0/6) 5 6  Total 20 22  Adjusted Score (based on education) 20 22   CRANIAL NERVES:   Normal conjugate, extra-ocular eye movements in all directions of gaze.  Mild left ptosis (old). Face is symmetric.  MOTOR:  Motor strength is 5/5 in all extremities, except including distally.  No atrophy, fasciculations or abnormal movements.  No pronator drift.  Tone is normal.    MSRs:  Reflexes are 2+/4 throughout, including ankles.  SENSORY:  Intact to vibration throughout, except reduced at the left ankle and great toe as compared to the right.    COORDINATION/GAIT:   Intact rapid alternating movements bilaterally.  Gait narrow based and stable. Tandem gait intact.      Data: NCS/EMG of the legs 10/28/2022: The electrophysiologic findings are suggestive of a polyradiculoneuropathy given the presence of prolonged late responses and temporal dispersion of the right tibial nerve. Interestingly, sensory responses are normal. Correlate clinically   Labs 10/24/2022: ESR 5, CRP <1.0, TSH 1.11, folate >23, SPEP with IFE no M  protein.  Copper  163  MRI brain wo contrast 04/22/2023: 1. No evidence of an acute intracranial abnormality. 2. No age advanced or lobar predominant parenchymal atrophy. 3. Mild chronic small vessel ischemic changes within the cerebral white matter. 4. Mild paranasal sinus mucosal thickening.  Lab Results  Component Value Date   TSH 1.11 10/24/2022    Thank you for allowing me to participate in patient's care.  If I can answer any additional questions, I would be pleased to do so.    Sincerely,    Jaleisa Brose K. Tobie, DO

## 2024-06-26 NOTE — Patient Instructions (Signed)
 Check vitamin B12  You have been referred for a neurocognitive evaluation (i.e., evaluation of memory and thinking abilities). Please bring someone with you to this appointment if possible, as it is helpful for the neuropsychologist to hear from both you and another adult who knows you well. Please bring eyeglasses and hearing aids if you wear them and take any medications as you normally would. Please fully abstain from all alcohol, marijuana, or other substances prior to your appointment.   The evaluation will take approximately 2-3 hours and has two parts:   The first part is a clinical interview with the neuropsychologist, Dr. Richie or Dr. Gayland. During the interview, the neuropsychologist will speak with you and the individual you brought to the appointment.    The second part of the evaluation is testing with the doctor's technician, aka psychometrician, Dana or [employer]. During the testing, the technician will ask you to remember different types of material, solve problems, and answer some questionnaires. Your family member will not be present for this portion of the evaluation.   Please note: We have to reserve several hours of the neuropsychologist's time and the psychometrician's time for your evaluation appointment. As such, there is a No-Show fee of $100. If you are unable to attend any of your appointments, please contact our office as soon as possible to reschedule.

## 2024-06-27 ENCOUNTER — Ambulatory Visit: Payer: Self-pay | Admitting: Neurology

## 2024-06-27 LAB — VITAMIN B12: Vitamin B-12: 475 pg/mL (ref 200–1100)

## 2024-07-18 DIAGNOSIS — Z823 Family history of stroke: Secondary | ICD-10-CM | POA: Diagnosis not present

## 2024-07-18 DIAGNOSIS — N400 Enlarged prostate without lower urinary tract symptoms: Secondary | ICD-10-CM | POA: Diagnosis not present

## 2024-07-18 DIAGNOSIS — E785 Hyperlipidemia, unspecified: Secondary | ICD-10-CM | POA: Diagnosis not present

## 2024-07-18 DIAGNOSIS — Z8249 Family history of ischemic heart disease and other diseases of the circulatory system: Secondary | ICD-10-CM | POA: Diagnosis not present

## 2024-07-18 DIAGNOSIS — G629 Polyneuropathy, unspecified: Secondary | ICD-10-CM | POA: Diagnosis not present

## 2024-07-18 DIAGNOSIS — Z85828 Personal history of other malignant neoplasm of skin: Secondary | ICD-10-CM | POA: Diagnosis not present

## 2024-07-18 DIAGNOSIS — N529 Male erectile dysfunction, unspecified: Secondary | ICD-10-CM | POA: Diagnosis not present

## 2024-08-13 ENCOUNTER — Institutional Professional Consult (permissible substitution): Admitting: Psychology

## 2024-08-13 ENCOUNTER — Ambulatory Visit: Payer: Self-pay

## 2024-08-21 ENCOUNTER — Encounter: Admitting: Psychology

## 2024-12-30 ENCOUNTER — Ambulatory Visit: Admitting: Neurology
